# Patient Record
Sex: Male | Born: 1945
Health system: Southern US, Community
[De-identification: ages and names within clinical notes are randomized; demographics above are authoritative.]

## PROBLEM LIST (undated history)

## (undated) DIAGNOSIS — C61 Malignant neoplasm of prostate: Secondary | ICD-10-CM

## (undated) DIAGNOSIS — E785 Hyperlipidemia, unspecified: Secondary | ICD-10-CM

## (undated) DIAGNOSIS — K8020 Calculus of gallbladder without cholecystitis without obstruction: Secondary | ICD-10-CM

## (undated) DIAGNOSIS — I10 Essential (primary) hypertension: Secondary | ICD-10-CM

## (undated) DIAGNOSIS — I4891 Unspecified atrial fibrillation: Secondary | ICD-10-CM

## (undated) HISTORY — DX: Essential (primary) hypertension: I10

## (undated) HISTORY — DX: Unspecified atrial fibrillation: I48.91

## (undated) HISTORY — DX: Hyperlipidemia, unspecified: E78.5

## (undated) HISTORY — DX: Calculus of gallbladder without cholecystitis without obstruction: K80.20

## (undated) HISTORY — DX: Malignant neoplasm of prostate: C61

---

## 2004-04-02 HISTORY — PX: PROSTATECTOMY: SHX69

## 2004-04-10 ENCOUNTER — Ambulatory Visit: Payer: Self-pay | Admitting: Family Medicine

## 2004-06-09 ENCOUNTER — Ambulatory Visit: Payer: Self-pay | Admitting: Family Medicine

## 2004-08-09 ENCOUNTER — Ambulatory Visit: Payer: Self-pay | Admitting: Family Medicine

## 2005-04-02 HISTORY — PX: HERNIA REPAIR: SHX51

## 2005-06-07 ENCOUNTER — Ambulatory Visit: Payer: Self-pay | Admitting: Family Medicine

## 2011-01-23 ENCOUNTER — Institutional Professional Consult (permissible substitution): Payer: Self-pay | Admitting: Internal Medicine

## 2011-01-29 ENCOUNTER — Telehealth: Payer: Self-pay | Admitting: Internal Medicine

## 2011-01-29 ENCOUNTER — Encounter: Payer: Self-pay | Admitting: Internal Medicine

## 2011-01-29 NOTE — Telephone Encounter (Signed)
Spoke with the pt and he sttaes he has not had a fever, just sore throat and a cough. He is set to see MW for difficulty breathing so I advised the pt to keep his appt for consult. Carron Curie, CMA

## 2011-01-30 ENCOUNTER — Ambulatory Visit (INDEPENDENT_AMBULATORY_CARE_PROVIDER_SITE_OTHER): Payer: Medicare Other | Admitting: Internal Medicine

## 2011-01-30 ENCOUNTER — Encounter: Payer: Self-pay | Admitting: Internal Medicine

## 2011-01-30 VITALS — BP 112/64 | HR 75 | Temp 97.7°F | Ht 75.0 in | Wt 231.0 lb

## 2011-01-30 DIAGNOSIS — I10 Essential (primary) hypertension: Secondary | ICD-10-CM

## 2011-01-30 DIAGNOSIS — R0602 Shortness of breath: Secondary | ICD-10-CM

## 2011-01-30 MED ORDER — AMLODIPINE-VALSARTAN-HCTZ 5-160-12.5 MG PO TABS: ORAL_TABLET | ORAL | Status: AC

## 2011-01-30 NOTE — Progress Notes (Signed)
  Subjective:    Patient ID: Jonathan Wiley, male    DOB: 1945/06/06, 65 y.o.   MRN: [id number]  HPI  46 yowm smoked only as teenager with unexplained sob since around 2010 referred by Dr Sol Passer for evaluation in Oct 2012.   01/30/2011 Initial pulmonary office eval on ACEI . Cc doe x sev years esp with exertion gradually worse, indolent onset, highly variable and not necessarily proportionate to ex but rarely at rest.  He thinks he sleeps ok, wife convinced he wheezes when sleeping. pfts by Dr Sol Passer with no airflow obst but truncated max exp flows.  No excess mucus. Sometimes so severe limited to more than room to room walking.  Denies nocturnal  or early am exacerbation  of respiratory  c/o's or need for noct saba. Also denies any obvious fluctuation of symptoms with weather or environmental changes or other aggravating or alleviating factors except as outlined above    Review of Systems  Constitutional: Negative for fever, chills, activity change, appetite change and unexpected weight change.  HENT: Positive for sore throat and sneezing. Negative for congestion, rhinorrhea, trouble swallowing, dental problem, voice change and postnasal drip.   Eyes: Negative for visual disturbance.  Respiratory: Positive for shortness of breath. Negative for cough and choking.   Cardiovascular: Negative for chest pain and leg swelling.  Gastrointestinal: Negative for nausea, vomiting and abdominal pain.  Genitourinary: Negative for difficulty urinating.  Musculoskeletal: Negative for arthralgias.  Skin: Positive for rash.  Psychiatric/Behavioral: Negative for behavioral problems and confusion.       Objective:   Physical Exam  01/30/2011 wt  231  Very gruff voiced amb wm nad with classic pseudowheeze  HEENT: nl dentition, turbinates, and orophanx. Nl external ear canals without cough reflex   NECK :  without JVD/Nodes/TM/ nl carotid upstrokes bilaterally   LUNGS: no acc muscle use, clear to A  and P bilaterally without cough on insp or exp maneuvers   CV:  RRR  no s3 or murmur or increase in P2, no edema   ABD:  soft and nontender with nl excursion in the supine position. No bruits or organomegaly, bowel sounds nl  MS:  warm without deformities, calf tenderness, cyanosis or clubbing  SKIN: warm and dry without lesions    NEURO:  alert, approp, no deficits       Assessment & Plan:

## 2011-01-30 NOTE — Patient Instructions (Signed)
Stop amlodipine and lisnopril and replace them with one exforge daily  Try prilosec 20mg   Take 30-60 min before first meal of the day and Pepcid 20 mg one bedtime until better (should only be couple of weeks)  I think of reflux for chronic cough like I do oxygen for fire (doesn't cause the fire but once you get the oxygen suppressed it usually goes away regardless of the exact cause).   Call for CPST in 4 weeks if not convinced you're better   If you are satisfied with your treatment plan let your doctor know and he/she can either refill your medications or you can return here when your prescription runs out.     If in any way you are not 100% satisfied,  please tell us.  If 100% better, tell your friends!

## 2011-01-31 ENCOUNTER — Encounter: Payer: Self-pay | Admitting: Internal Medicine

## 2011-01-31 DIAGNOSIS — I10 Essential (primary) hypertension: Secondary | ICD-10-CM | POA: Insufficient documentation

## 2011-01-31 NOTE — Assessment & Plan Note (Signed)

## 2011-01-31 NOTE — Assessment & Plan Note (Addendum)
This is almost certainly  Classic Upper airway cough syndrome, so named because it's frequently impossible to sort out how much is  CR/sinusitis with freq throat clearing (which can be related to primary GERD)   vs  causing  secondary (" extra esophageal")  GERD from wide swings in gastric pressure that occur with throat clearing, often  promoting self use of mint and menthol lozenges that reduce the lower esophageal sphincter tone and exacerbate the problem further in a cyclical fashion.   These are the same pts who not infrequently have failed to tolerate ace inhibitors,  dry powder inhalers or biphosphonates or report having reflux symptoms that don't respond to standard doses of PPI , and are easily confused as having aecopd or asthma flares,   First step is stop acei then regroup if not back to baseline  Of note:  When respiratory symptoms begin well after a patient reports complete smoking cessation,  it is very hard to "blame" COPD specifically or airways disorders in general  ie it doesn't make any more sense than hearing a  [employer]SC[employer] driver wrecked his car while driving his kids to school or a surgeon sliced his hand off carving roast beef (it must be rare indeed!)     That is to say, once the high risk activity stops,  the symptoms should not suddenly erupt or markedly worsen.  If so, the differential diagnosis should include  obesity/deconditioning,  LPR/Reflux/Aspiration syndromes,  occult CHF, or  especially side effect of medications commonly used in this population., especially ace inhibitors.  LATE ADD: cxr reviewed from 11/2010 shows elevated R HD but no comparison studies, may need to consider sniff test under fluoroscopy if no previous cxr's can establish chronicity

## 2016-04-04 DIAGNOSIS — G5621 Lesion of ulnar nerve, right upper limb: Secondary | ICD-10-CM | POA: Diagnosis not present

## 2016-04-05 DIAGNOSIS — Z01818 Encounter for other preprocedural examination: Secondary | ICD-10-CM | POA: Diagnosis not present

## 2016-04-05 DIAGNOSIS — I10 Essential (primary) hypertension: Secondary | ICD-10-CM | POA: Diagnosis not present

## 2016-04-05 DIAGNOSIS — J45909 Unspecified asthma, uncomplicated: Secondary | ICD-10-CM | POA: Diagnosis not present

## 2016-04-05 DIAGNOSIS — G5621 Lesion of ulnar nerve, right upper limb: Secondary | ICD-10-CM | POA: Diagnosis not present

## 2016-04-12 DIAGNOSIS — E785 Hyperlipidemia, unspecified: Secondary | ICD-10-CM | POA: Diagnosis not present

## 2016-04-12 DIAGNOSIS — G4733 Obstructive sleep apnea (adult) (pediatric): Secondary | ICD-10-CM | POA: Diagnosis not present

## 2016-04-12 DIAGNOSIS — J208 Acute bronchitis due to other specified organisms: Secondary | ICD-10-CM | POA: Diagnosis not present

## 2016-04-12 DIAGNOSIS — J209 Acute bronchitis, unspecified: Secondary | ICD-10-CM | POA: Diagnosis not present

## 2016-04-12 DIAGNOSIS — I480 Paroxysmal atrial fibrillation: Secondary | ICD-10-CM | POA: Diagnosis not present

## 2016-04-12 DIAGNOSIS — E784 Other hyperlipidemia: Secondary | ICD-10-CM | POA: Diagnosis not present

## 2016-04-12 DIAGNOSIS — I10 Essential (primary) hypertension: Secondary | ICD-10-CM | POA: Diagnosis not present

## 2016-04-12 DIAGNOSIS — I2510 Atherosclerotic heart disease of native coronary artery without angina pectoris: Secondary | ICD-10-CM | POA: Diagnosis not present

## 2016-04-16 DIAGNOSIS — N2889 Other specified disorders of kidney and ureter: Secondary | ICD-10-CM | POA: Diagnosis not present

## 2016-04-16 DIAGNOSIS — K760 Fatty (change of) liver, not elsewhere classified: Secondary | ICD-10-CM | POA: Diagnosis not present

## 2016-04-16 DIAGNOSIS — Q6102 Congenital multiple renal cysts: Secondary | ICD-10-CM | POA: Diagnosis not present

## 2016-04-16 DIAGNOSIS — I700 Atherosclerosis of aorta: Secondary | ICD-10-CM | POA: Diagnosis not present

## 2016-04-16 DIAGNOSIS — C61 Malignant neoplasm of prostate: Secondary | ICD-10-CM | POA: Diagnosis not present

## 2016-04-16 DIAGNOSIS — N289 Disorder of kidney and ureter, unspecified: Secondary | ICD-10-CM | POA: Diagnosis not present

## 2016-04-17 DIAGNOSIS — E785 Hyperlipidemia, unspecified: Secondary | ICD-10-CM | POA: Diagnosis not present

## 2016-04-17 DIAGNOSIS — I2510 Atherosclerotic heart disease of native coronary artery without angina pectoris: Secondary | ICD-10-CM | POA: Diagnosis not present

## 2016-04-17 DIAGNOSIS — E784 Other hyperlipidemia: Secondary | ICD-10-CM | POA: Diagnosis not present

## 2016-04-17 DIAGNOSIS — I10 Essential (primary) hypertension: Secondary | ICD-10-CM | POA: Diagnosis not present

## 2016-04-17 DIAGNOSIS — I480 Paroxysmal atrial fibrillation: Secondary | ICD-10-CM | POA: Diagnosis not present

## 2016-04-26 DIAGNOSIS — N2889 Other specified disorders of kidney and ureter: Secondary | ICD-10-CM | POA: Diagnosis not present

## 2016-04-26 DIAGNOSIS — C61 Malignant neoplasm of prostate: Secondary | ICD-10-CM | POA: Diagnosis not present

## 2016-05-01 DIAGNOSIS — E785 Hyperlipidemia, unspecified: Secondary | ICD-10-CM | POA: Diagnosis not present

## 2016-05-01 DIAGNOSIS — K219 Gastro-esophageal reflux disease without esophagitis: Secondary | ICD-10-CM | POA: Diagnosis not present

## 2016-05-01 DIAGNOSIS — Z7901 Long term (current) use of anticoagulants: Secondary | ICD-10-CM | POA: Diagnosis not present

## 2016-05-01 DIAGNOSIS — I4891 Unspecified atrial fibrillation: Secondary | ICD-10-CM | POA: Diagnosis not present

## 2016-05-01 DIAGNOSIS — E663 Overweight: Secondary | ICD-10-CM | POA: Diagnosis not present

## 2016-05-01 DIAGNOSIS — Z9981 Dependence on supplemental oxygen: Secondary | ICD-10-CM | POA: Diagnosis not present

## 2016-05-01 DIAGNOSIS — G4730 Sleep apnea, unspecified: Secondary | ICD-10-CM | POA: Diagnosis not present

## 2016-05-01 DIAGNOSIS — G5621 Lesion of ulnar nerve, right upper limb: Secondary | ICD-10-CM | POA: Diagnosis not present

## 2016-05-01 DIAGNOSIS — Z79899 Other long term (current) drug therapy: Secondary | ICD-10-CM | POA: Diagnosis not present

## 2016-05-01 DIAGNOSIS — I10 Essential (primary) hypertension: Secondary | ICD-10-CM | POA: Diagnosis not present

## 2016-05-09 DIAGNOSIS — E870 Hyperosmolality and hypernatremia: Secondary | ICD-10-CM | POA: Diagnosis not present

## 2016-05-09 DIAGNOSIS — I4891 Unspecified atrial fibrillation: Secondary | ICD-10-CM | POA: Diagnosis not present

## 2016-05-09 DIAGNOSIS — Z9109 Other allergy status, other than to drugs and biological substances: Secondary | ICD-10-CM | POA: Diagnosis not present

## 2016-05-09 DIAGNOSIS — J45909 Unspecified asthma, uncomplicated: Secondary | ICD-10-CM | POA: Diagnosis not present

## 2016-05-09 DIAGNOSIS — J449 Chronic obstructive pulmonary disease, unspecified: Secondary | ICD-10-CM | POA: Diagnosis not present

## 2016-06-01 DIAGNOSIS — M5412 Radiculopathy, cervical region: Secondary | ICD-10-CM | POA: Diagnosis not present

## 2016-06-06 DIAGNOSIS — J449 Chronic obstructive pulmonary disease, unspecified: Secondary | ICD-10-CM | POA: Diagnosis not present

## 2016-06-11 DIAGNOSIS — G5621 Lesion of ulnar nerve, right upper limb: Secondary | ICD-10-CM | POA: Diagnosis not present

## 2016-06-29 DIAGNOSIS — G4733 Obstructive sleep apnea (adult) (pediatric): Secondary | ICD-10-CM | POA: Diagnosis not present

## 2016-07-09 DIAGNOSIS — H2513 Age-related nuclear cataract, bilateral: Secondary | ICD-10-CM | POA: Diagnosis not present

## 2016-08-13 DIAGNOSIS — K219 Gastro-esophageal reflux disease without esophagitis: Secondary | ICD-10-CM | POA: Diagnosis not present

## 2016-08-13 DIAGNOSIS — R0789 Other chest pain: Secondary | ICD-10-CM | POA: Diagnosis not present

## 2016-08-13 DIAGNOSIS — J449 Chronic obstructive pulmonary disease, unspecified: Secondary | ICD-10-CM | POA: Diagnosis not present

## 2016-08-13 DIAGNOSIS — Z87898 Personal history of other specified conditions: Secondary | ICD-10-CM | POA: Diagnosis not present

## 2016-08-13 DIAGNOSIS — Z79899 Other long term (current) drug therapy: Secondary | ICD-10-CM | POA: Diagnosis not present

## 2016-08-13 DIAGNOSIS — I10 Essential (primary) hypertension: Secondary | ICD-10-CM | POA: Diagnosis not present

## 2016-08-13 DIAGNOSIS — E785 Hyperlipidemia, unspecified: Secondary | ICD-10-CM | POA: Diagnosis not present

## 2016-08-21 DIAGNOSIS — L821 Other seborrheic keratosis: Secondary | ICD-10-CM | POA: Diagnosis not present

## 2016-08-21 DIAGNOSIS — D1801 Hemangioma of skin and subcutaneous tissue: Secondary | ICD-10-CM | POA: Diagnosis not present

## 2016-09-05 DIAGNOSIS — G4730 Sleep apnea, unspecified: Secondary | ICD-10-CM | POA: Diagnosis not present

## 2016-09-05 DIAGNOSIS — I2510 Atherosclerotic heart disease of native coronary artery without angina pectoris: Secondary | ICD-10-CM | POA: Diagnosis not present

## 2016-09-05 DIAGNOSIS — I480 Paroxysmal atrial fibrillation: Secondary | ICD-10-CM | POA: Diagnosis not present

## 2016-09-13 DIAGNOSIS — G5621 Lesion of ulnar nerve, right upper limb: Secondary | ICD-10-CM | POA: Diagnosis not present

## 2016-09-27 DIAGNOSIS — I480 Paroxysmal atrial fibrillation: Secondary | ICD-10-CM | POA: Diagnosis not present

## 2016-10-01 ENCOUNTER — Ambulatory Visit: Payer: Medicare Other | Admitting: Cardiology

## 2016-10-01 DIAGNOSIS — I480 Paroxysmal atrial fibrillation: Secondary | ICD-10-CM | POA: Diagnosis not present

## 2016-10-30 DIAGNOSIS — T63481A Toxic effect of venom of other arthropod, accidental (unintentional), initial encounter: Secondary | ICD-10-CM | POA: Diagnosis not present

## 2016-10-30 DIAGNOSIS — M7989 Other specified soft tissue disorders: Secondary | ICD-10-CM | POA: Diagnosis not present

## 2016-11-05 ENCOUNTER — Ambulatory Visit (INDEPENDENT_AMBULATORY_CARE_PROVIDER_SITE_OTHER): Payer: PPO | Admitting: Specialist

## 2016-11-05 ENCOUNTER — Encounter (INDEPENDENT_AMBULATORY_CARE_PROVIDER_SITE_OTHER): Payer: Self-pay | Admitting: Specialist

## 2016-11-05 VITALS — BP 126/88 | HR 86 | Ht 75.0 in | Wt 233.0 lb

## 2016-11-05 DIAGNOSIS — I4891 Unspecified atrial fibrillation: Secondary | ICD-10-CM | POA: Diagnosis not present

## 2016-11-05 DIAGNOSIS — M6281 Muscle weakness (generalized): Secondary | ICD-10-CM

## 2016-11-05 DIAGNOSIS — G5621 Lesion of ulnar nerve, right upper limb: Secondary | ICD-10-CM

## 2016-11-05 DIAGNOSIS — G4733 Obstructive sleep apnea (adult) (pediatric): Secondary | ICD-10-CM | POA: Diagnosis not present

## 2016-11-05 DIAGNOSIS — M4802 Spinal stenosis, cervical region: Secondary | ICD-10-CM | POA: Diagnosis not present

## 2016-11-05 DIAGNOSIS — I10 Essential (primary) hypertension: Secondary | ICD-10-CM | POA: Diagnosis not present

## 2016-11-05 NOTE — Progress Notes (Signed)
Office Visit Note   Patient: Jonathan Wiley           Date of Birth: [birth year]           MRN: [id number] Visit Date: 11/05/2016              Requested by: Creig Hines, MD [place], [place] [address] PCP: Melony Overly, MD   Assessment & Plan: Visit Diagnoses:  1. Spinal stenosis of cervical region   2. 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 Muscle weakness of left arm     Plan:Avoid overhead lifting and overhead use of the arms. Do not lift greater than 15 lbs. Adjust head rest in vehicle to prevent hyperextension if rear ended. Take extra precautions to avoid falling, including use of a cane if you feel weak. .     Follow-Up Instructions: Return in about 4 weeks (around 12/03/2016).   Orders:  No orders of the defined types were placed in this encounter.  No orders of the defined types were placed in this encounter.     Procedures: No procedures performed   Clinical Data: No additional findings.   Subjective: Chief Complaint  Patient presents with  . Neck - Pain  . Lower Back - Pain    71 year old male right handed, with about a one year history of neck pain with rectal surgery 09/2015. Had numbness into the right hand ulnar digits, 2 EMG/NCVs tests were negative. MRI of the cervical spine was done. He underwent right ulnar neuroplasty 05/2016. The MRI of the neck showed areas of concern that has the paitent concerned about future neck issues. Presently occasionally with neck pain posterior cervicodorsal and to the left side and mid area. No bowel or bladder difficulties. Had MRI of left shoulder. Both MRIs of the shoulder and cervical spine were done  [facility]  Some drainage for the rectal area. The infection was a perirectal abcess. No balance or coordination abnormalities. Saw a neurologist in the past told him that he had a weakened nervous system. Taking gabapentin 100 mg at night. There is some cervical spine stiffness and  sometimes with a popping sound. No increased clumbsiness.       Review of Systems  Constitutional: Negative for activity change, appetite change, chills, fatigue, fever and unexpected weight change.  HENT: Negative for congestion, facial swelling, sinus pain, sinus pressure, sneezing, sore throat and tinnitus.   Eyes: Positive for visual disturbance. Negative for pain, discharge, redness and itching.  Respiratory: Positive for apnea. Negative for cough, choking, shortness of breath and wheezing.   Cardiovascular: Negative.  Negative for chest pain, palpitations and leg swelling.  Gastrointestinal: Positive for constipation and rectal pain. Negative for abdominal distention, abdominal pain, anal bleeding, blood in stool, diarrhea, nausea and vomiting.  Endocrine: Negative.   Genitourinary: Negative.  Negative for difficulty urinating, dysuria, flank pain, frequency and hematuria.  Musculoskeletal: Positive for back pain, neck pain and neck stiffness. Negative for arthralgias, gait problem and joint swelling.  Skin: Negative.  Negative for color change, pallor, rash and wound.  Allergic/Immunologic: Negative.  Negative for environmental allergies and food allergies.  Neurological: Positive for weakness and numbness.  Hematological: Negative for adenopathy. Bruises/bleeds easily.  Psychiatric/Behavioral: Negative.  Negative for agitation, behavioral problems, confusion, decreased concentration, dysphoric mood, hallucinations, self-injury, sleep disturbance and suicidal ideas. The patient is not nervous/anxious and is not hyperactive.      Objective: Vital Signs: BP 126/88 (  BP Location: Left Arm, Patient Position: Sitting, Cuff Size: Normal)   Pulse 86   Ht 6\' 3"  (1.905 m)   Wt 233 lb (105.7 kg)   BMI 29.12 kg/m   Physical Exam  Constitutional: He is oriented to person, place, and time. He appears well-developed and well-nourished.  HENT:  Head: Normocephalic and atraumatic.  Eyes:  Pupils are equal, round, and reactive to light. EOM are normal.  Neck: Normal range of motion. Neck supple.  Pulmonary/Chest: Effort normal and breath sounds normal.  Abdominal: Soft. Bowel sounds are normal.  Musculoskeletal: Normal range of motion.  Neurological: He is alert and oriented to person, place, and time.  Skin: Skin is warm and dry.  Psychiatric: He has a normal mood and affect. His behavior is normal. Judgment and thought content normal.    Ortho Exam  Specialty Comments:  No specialty comments available.  Imaging: No results found.   PMFS History: Patient Active Problem List   Diagnosis Date Noted  . Hypertension 01/31/2011  . SOB (shortness of breath) 01/30/2011   Past Medical History:  Diagnosis Date  . Atrial fibrillation ([place])   . Gallstones   . Hyperlipidemia   . Hypertension   . Prostate cancer [facility])     Family History  Problem Relation Age of Onset  . Clotting disorder Mother   . Prostate cancer Father     Past Surgical History:  Procedure Laterality Date  . HERNIA REPAIR  2007  . PROSTATECTOMY  2006   Social History   Occupational History  . 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 Smoking status: Former Smoker    Packs/day: 0.30    Years: 3.00    Types: Cigarettes    Quit date: 04/02/1958  . Smokeless tobacco: Never Used  . Alcohol use No  . Drug use: No  . Sexual activity: Not on file

## 2016-11-05 NOTE — Patient Instructions (Signed)
Plan:Avoid overhead lifting and overhead use of the arms. Do not lift greater than 15 lbs. Adjust head rest in vehicle to prevent hyperextension if rear ended. Take extra precautions to avoid falling, including use of a cane if you feel weak.

## 2016-12-06 ENCOUNTER — Ambulatory Visit (INDEPENDENT_AMBULATORY_CARE_PROVIDER_SITE_OTHER): Payer: PPO | Admitting: Specialist

## 2016-12-06 ENCOUNTER — Other Ambulatory Visit (INDEPENDENT_AMBULATORY_CARE_PROVIDER_SITE_OTHER): Payer: Self-pay | Admitting: Specialist

## 2016-12-06 ENCOUNTER — Ambulatory Visit (INDEPENDENT_AMBULATORY_CARE_PROVIDER_SITE_OTHER): Payer: PPO

## 2016-12-06 DIAGNOSIS — M4802 Spinal stenosis, cervical region: Secondary | ICD-10-CM

## 2016-12-06 DIAGNOSIS — G5621 Lesion of ulnar nerve, right upper limb: Secondary | ICD-10-CM

## 2016-12-06 NOTE — Patient Instructions (Addendum)
Avoid overhead lifting and overhead use of the arms. Do not lift greater than 5 lbs. Adjust head rest in vehicle to prevent hyperextension if rear ended. Dr. Romona Curls secretary will contact you to arrange for EMG/NCV of the arms to assess the healing of the right ulnar nerve and for any signs of cervical nerve irritation.

## 2016-12-06 NOTE — Progress Notes (Signed)
   Office Visit Note   Patient: Jonathan Wiley           Date of Birth: 1945/12/17           MRN: [id number] Visit Date: 12/06/2016              Requested by: Melony Overly, MD [address] [place], [place] [address] PCP: Melony Overly, MD   Assessment & Plan: Visit Diagnoses:  1. Spinal stenosis of cervical region   2. 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 Do not lift greater than 5 lbs. Adjust head rest in vehicle to prevent hyperextension if rear ended. Dr. Romona Curls secretary will contact you to arrange for EMG/NCV of the arms to assess the healing of the right ulnar nerve and for any signs of cervical nerve irritation.    Follow-Up Instructions: No Follow-up on file.   Orders:  Orders Placed This Encounter  Procedures  . XR Cervical Spine 2 or 3 views   No orders of the defined types were placed in this encounter.     Procedures: No procedures performed   Clinical Data: No additional findings.   Subjective: No chief complaint on file.   HPI  Review of Systems   Objective: Vital Signs: There were no vitals taken for this visit.  Physical Exam  Ortho Exam  Specialty Comments:  No specialty comments available.  Imaging: No results found.   PMFS History: Patient Active Problem List   Diagnosis Date Noted  . Hypertension 01/31/2011  . SOB (shortness of breath) 01/30/2011   Past Medical History:  Diagnosis Date  . Atrial fibrillation ([place])   . Gallstones   . Hyperlipidemia   . Hypertension   . Prostate cancer [facility])     Family History  Problem Relation Age of Onset  . Clotting disorder Mother   . Prostate cancer Father     Past Surgical History:  Procedure Laterality Date  . HERNIA REPAIR  2007  . PROSTATECTOMY  2006   Social History   Occupational History  . 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 Smoking status: Former  Smoker    Packs/day: 0.30    Years: 3.00    Types: Cigarettes    Quit date: 04/02/1958  . Smokeless tobacco: Never Used  . Alcohol use No  . Drug use: No  . Sexual activity: Not on file

## 2016-12-18 DIAGNOSIS — H2512 Age-related nuclear cataract, left eye: Secondary | ICD-10-CM | POA: Diagnosis not present

## 2016-12-18 DIAGNOSIS — H2513 Age-related nuclear cataract, bilateral: Secondary | ICD-10-CM | POA: Diagnosis not present

## 2016-12-18 DIAGNOSIS — H25013 Cortical age-related cataract, bilateral: Secondary | ICD-10-CM | POA: Diagnosis not present

## 2016-12-18 DIAGNOSIS — I10 Essential (primary) hypertension: Secondary | ICD-10-CM | POA: Diagnosis not present

## 2016-12-18 DIAGNOSIS — H25043 Posterior subcapsular polar age-related cataract, bilateral: Secondary | ICD-10-CM | POA: Diagnosis not present

## 2016-12-21 ENCOUNTER — Encounter (INDEPENDENT_AMBULATORY_CARE_PROVIDER_SITE_OTHER): Payer: Self-pay | Admitting: Physical Medicine and Rehabilitation

## 2016-12-21 ENCOUNTER — Ambulatory Visit (INDEPENDENT_AMBULATORY_CARE_PROVIDER_SITE_OTHER): Payer: PPO | Admitting: Physical Medicine and Rehabilitation

## 2016-12-21 DIAGNOSIS — R202 Paresthesia of skin: Secondary | ICD-10-CM

## 2016-12-21 NOTE — Progress Notes (Deleted)
Right hand dominant. Tingling mostly in right fourth and fifth fingers. Patient said Dr. Louanne Skye also wanted to look at left arm- referral states right. Has weakness in left arm, but no tingling.

## 2016-12-25 NOTE — Progress Notes (Signed)
Jonathan Wiley - 71 y.o. male MRN [id number]  Date of birth: [birth year]  Office Visit Note: Visit Date: 12/21/2016 PCP: Melony Overly, MD Referred by: Melony Overly, MD  Subjective: Chief Complaint  Patient presents with  . Right Hand - Numbness  . Left Arm - Weakness   HPI: Jonathan Wiley is a very pleasant 71 year old right-hand dominant gentleman who comes in today at the request of Dr. Louanne Skye for electrodiagnostic study of the right upper extremity to assess healing of the older nerve after right ulnar neuroplasty 05/2016 as well as evaluating any radiculopathy present. The patient's history is somewhat convoluted. He has had most of his care and [place] through [facility]. He reports tingling type symptoms in the right fourth and fifth fingers. He reports some neck pain. He also reports weakness in the left arm but no pain or tingling or numbness. He has had a prior electrodiagnostic study by Dr. Barrington Ellison At [facility] and [facility]. This was completed 10/17/2015 and reviewed below the specialty comments. Unfortunately we do not have the actual study but we did have the impression. The impression was a generalized sensorimotor polyneuropathy with some axonal features. They also diagnosed to superimpose right ulnar neuropathy without mention of the severity. The patient went on to have an MRI of the cervical spine on 11/16/2015 showing moderate central canal stenosis and some foraminal stenosis at C5-6. There was a retrolisthesis MC 5 on C6. The patient then went on to have an ulnar nerve neuroplasty in February 2018 as mentioned above. He reports that between the July 2017 nerve study and his surgery of the ulnar nerve there was another electrodiagnostic study performed. He does not know the results of that we do not have that report to review.    ROS Otherwise per HPI.  Assessment & Plan: Visit Diagnoses:  1. Paresthesia of skin     Plan: No additional  findings.  Impression: The above electrodiagnostic study is ABNORMAL and reveals evidence of a mild right median nerve entrapment at the wrist (carpal tunnel syndrome) affecting sensory components.   There is no significant electrodiagnostic evidence of any other focal nerve entrapment, brachial plexopathy or cervical radiculopathy. **Specifically, the right ulnar motor and sensory nerve conductions were normal. At least in the right upper extremity there is no evidence of a sensory motor polyneuropathy which was diagnosed in a prior electrodiagnostic study. The patient has had electrodiagnostic study that was completed after the July 2017 study and was also after the ulnar nerve decompression. This study would be interesting to review in light of normal findings today.  As you know, this particular electrodiagnostic study cannot rule out chemical radiculitis or sensory only radiculopathy.  Recommendations: 1.  Follow-up with referring physician. 2.  Continue current management of symptoms. 3.  Suggest use of resting splint at night-time and as needed during the day. Consider diagnostic carpal tunnel injection.  Meds & Orders: No orders of the defined types were placed in this encounter.   Orders Placed This Encounter  Procedures  . NCV with EMG (electromyography)    Follow-up: Return for Dr. Louanne Skye.   Procedures: No procedures performed  EMG & NCV Findings: Evaluation of the right median (across palm) sensory nerve showed prolonged distal peak latency (Wrist, 4.4 ms) and prolonged distal peak latency (Palm, 2.4 ms).  All remaining nerves (as indicated in the following tables) were within normal limits.    All examined muscles (as indicated in the  following table) showed no evidence of electrical instability.    Impression: The above electrodiagnostic study is ABNORMAL and reveals evidence of a mild right median nerve entrapment at the wrist (carpal tunnel syndrome) affecting sensory  components.   There is no significant electrodiagnostic evidence of any other focal nerve entrapment, brachial plexopathy or cervical radiculopathy. Specifically, the right ulnar motor and sensory nerve conductions were normal. At least in the right upper extremity there is no evidence of a sensory motor polyneuropathy which was diagnosed in a prior electrodiagnostic study. The patient has had electrodiagnostic study that was completed after the July 2017 study and was also after the ulnar nerve decompression. This study would be interesting to review in light of normal findings today.  Recommendations: 1.  Follow-up with referring physician. 2.  Continue current management of symptoms. 3.  Suggest use of resting splint at night-time and as needed during the day. Consider diagnostic carpal tunnel injection.   Nerve Conduction Studies Anti Sensory Summary Table   Stim Site NR Peak (ms) Norm Peak (ms) P-T Amp (V) Norm P-T Amp Site1 Site2 Delta-P (ms) Dist (cm) Vel (m/s) Norm Vel (m/s)  Right Median Acr Palm Anti Sensory (2nd Digit)  32.2C  Wrist    *4.4 <3.6 20.6 >10 Wrist Palm 2.0 0.0    Palm    *2.4 <2.0 20.3         Right Ulnar Anti Sensory (5th Digit)  32.5C  Wrist    3.4 <3.7 15.9 >15.0 Wrist 5th Digit 3.4 14.0 41 >38   Motor Summary Table   Stim Site NR Onset (ms) Norm Onset (ms) O-P Amp (mV) Norm O-P Amp Site1 Site2 Delta-0 (ms) Dist (cm) Vel (m/s) Norm Vel (m/s)  Right Median Motor (Abd Poll Brev)  32.9C  Wrist    4.0 <4.2 7.6 >5 Elbow Wrist 4.5 24.0 53 >50  Elbow    8.5  6.5         Right Ulnar Motor (Abd Dig Min)  32.6C  Wrist    3.2 <4.2 9.0 >3 B Elbow Wrist 4.5 25.0 56 >53  B Elbow    7.7  9.0  A Elbow B Elbow 1.7 10.0 59 >53  A Elbow    9.4  9.0          EMG   Side Muscle Nerve Root Ins Act Fibs Psw Amp Dur Poly Recrt Int Fraser Din Comment  Right 1stDorInt Ulnar C8-T1 Nml Nml Nml Nml Nml 0 Nml Nml   Right Abd Poll Brev Median C8-T1 Nml Nml Nml Nml Nml 0 Nml Nml   Right  ExtDigCom   Nml Nml Nml Nml Nml 0 Nml Nml   Right Triceps Radial C6-7-8 Nml Nml Nml Nml Nml 0 Nml Nml   Right Deltoid Axillary C5-6 Nml Nml Nml Nml Nml 0 Nml Nml     Nerve Conduction Studies Anti Sensory Left/Right Comparison   Stim Site L Lat (ms) R Lat (ms) L-R Lat (ms) L Amp (V) R Amp (V) L-R Amp (%) Site1 Site2 L Vel (m/s) R Vel (m/s) L-R Vel (m/s)  Median Acr Palm Anti Sensory (2nd Digit)  32.2C  Wrist  *4.4   20.6  Wrist Palm     Palm  *2.4   20.3        Ulnar Anti Sensory (5th Digit)  32.5C  Wrist  3.4   15.9  Wrist 5th Digit  41    Motor Left/Right Comparison   Stim Site L Lat (ms)  R Lat (ms) L-R Lat (ms) L Amp (mV) R Amp (mV) L-R Amp (%) Site1 Site2 L Vel (m/s) R Vel (m/s) L-R Vel (m/s)  Median Motor (Abd Poll Brev)  32.9C  Wrist  4.0   7.6  Elbow Wrist  53   Elbow  8.5   6.5        Ulnar Motor (Abd Dig Min)  32.6C  Wrist  3.2   9.0  B Elbow Wrist  56   B Elbow  7.7   9.0  A Elbow B Elbow  59   A Elbow  9.4   9.0           Waveforms:           Clinical History: EMG/NCV 10/17/15: By Dr. Barrington Ellison At [facility] Impression: 71 year old male who appears to have a generalized sensorimotor polyneuropathy with both axonal and some demyelinating features. He states this occurred after being on antibiotics for rectal abscess. Could this have been a reaction to the antibiotics or part of a CIDP picture is uncertain. I have discussed the findings of his nerve conduction studies with him.  Superimposed right ulnar neuropathy most likely at the elbow on the right side. I cannot exclude a more proximal or cervical etiology.   Abnormal MRI of the Cervical spine without contrast. 11/16/2015   1. C5-C6 mild-moderate central spinal canal stenosis secondary to degenerative disc and endplate changes. No cord myelopathic signal changes. Bilateral neural foraminal stenosis with possible dynamic impingement upon either exiting C6 nerve roots. 2. Multilevel  degenerative disc changes with associated minor disc bulge effacing the thecal sac at C3-4, C4-5, C6-7, C7-T1. Result Narrative  FINDINGS: Levels Imaged: Craniocervical junction to T3-4. Alignment: C5 grade 1 retrolisthesis upon C6. C3 grade 1 retrolisthesis upon C4.Preserved spine curvature. Vertebrae: No marrow signal abnormalities.  Spinal cord: Normal signal and contour. Cervicocranial junction: No significant focal abnormality. Dominant left vertebral artery seen. C1-C2: No significant focal abnormality. C2-C3: No significant focal abnormality. C3-C4: Posterior central disc bulge effaces the ventral thecal sac. C4-C5: Posterior central disc bulge effaces the ventral thecal sac. C5-C6: Large broad-based disc bulge is seen with bilateral uncovertebral hypertrophy. Produces mild-moderate central spinal canal stenosis. Severe right, moderate-severe left neural foraminal stenosis with possible dynamic impingement upon either exiting C6 nerve roots. C6-C7: Mild posterior disc prominence effaces the ventral thecal sac. Mild left neural foraminal stenosis. C7-T1: Posterior disc prominence effaces the ventral thecal sac. Upper Thoracic Spine: No significant focal abnormality.  He reports that he quit smoking about 58 years ago. His smoking use included Cigarettes. He has a 0.90 pack-year smoking history. He has never used smokeless tobacco. No results for input(s): HGBA1C, LABURIC in the last 8760 hours.  Objective:  VS:  HT:    WT:   BMI:     BP:   HR: bpm  TEMP: ( )  RESP:  Physical Exam  Musculoskeletal:  Inspection reveals no atrophy of the bilateral APB or FDI or hand intrinsics. There is no swelling, color changes, allodynia or dystrophic changes. There is well-healed surgical scar over the right cubital tunnel. There is 5 out of 5 strength in the bilateral wrist extension, finger abduction and long finger flexion. There is intact sensation to light touch in all dermatomal and  peripheral nerve distributions. There is a negative Froment's test bilaterally. There is a negative Hoffmann's test bilaterally.    Ortho Exam Imaging: No results found.  Past Medical/Family/Surgical/Social History: Medications & Allergies  reviewed per EMR Patient Active Problem List   Diagnosis Date Noted  . Hypertension 01/31/2011  . SOB (shortness of breath) 01/30/2011   Past Medical History:  Diagnosis Date  . Atrial fibrillation ([place])   . Gallstones   . Hyperlipidemia   . Hypertension   . Prostate cancer [facility])    Family History  Problem Relation Age of Onset  . Clotting disorder Mother   . Prostate cancer Father    Past Surgical History:  Procedure Laterality Date  . HERNIA REPAIR  2007  . PROSTATECTOMY  2006   Social History   Occupational History  . 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 Smoking status: Former Smoker    Packs/day: 0.30    Years: 3.00    Types: Cigarettes    Quit date: 04/02/1958  . Smokeless tobacco: Never Used  . Alcohol use No  . Drug use: No  . Sexual activity: Not on file

## 2016-12-25 NOTE — Procedures (Signed)
EMG & NCV Findings: Evaluation of the right median (across palm) sensory nerve showed prolonged distal peak latency (Wrist, 4.4 ms) and prolonged distal peak latency (Palm, 2.4 ms).  All remaining nerves (as indicated in the following tables) were within normal limits.    All examined muscles (as indicated in the following table) showed no evidence of electrical instability.    Impression: The above electrodiagnostic study is ABNORMAL and reveals evidence of a mild right median nerve entrapment at the wrist (carpal tunnel syndrome) affecting sensory components.   There is no significant electrodiagnostic evidence of any other focal nerve entrapment, brachial plexopathy or cervical radiculopathy. Specifically, the right ulnar motor and sensory nerve conductions were normal. At least in the right upper extremity there is no evidence of a sensory motor polyneuropathy which was diagnosed in a prior electrodiagnostic study. The patient has had electrodiagnostic study that was completed after the July 2017 study and was also after the ulnar nerve decompression. This study would be interesting to review in light of normal findings today.  Recommendations: 1.  Follow-up with referring physician. 2.  Continue current management of symptoms. 3.  Suggest use of resting splint at night-time and as needed during the day. Consider diagnostic carpal tunnel injection.   Nerve Conduction Studies Anti Sensory Summary Table   Stim Site NR Peak (ms) Norm Peak (ms) P-T Amp (V) Norm P-T Amp Site1 Site2 Delta-P (ms) Dist (cm) Vel (m/s) Norm Vel (m/s)  Right Median Acr Palm Anti Sensory (2nd Digit)  32.2C  Wrist    *4.4 <3.6 20.6 >10 Wrist Palm 2.0 0.0    Palm    *2.4 <2.0 20.3         Right Ulnar Anti Sensory (5th Digit)  32.5C  Wrist    3.4 <3.7 15.9 >15.0 Wrist 5th Digit 3.4 14.0 41 >38   Motor Summary Table   Stim Site NR Onset (ms) Norm Onset (ms) O-P Amp (mV) Norm O-P Amp Site1 Site2 Delta-0 (ms) Dist  (cm) Vel (m/s) Norm Vel (m/s)  Right Median Motor (Abd Poll Brev)  32.9C  Wrist    4.0 <4.2 7.6 >5 Elbow Wrist 4.5 24.0 53 >50  Elbow    8.5  6.5         Right Ulnar Motor (Abd Dig Min)  32.6C  Wrist    3.2 <4.2 9.0 >3 B Elbow Wrist 4.5 25.0 56 >53  B Elbow    7.7  9.0  A Elbow B Elbow 1.7 10.0 59 >53  A Elbow    9.4  9.0          EMG   Side Muscle Nerve Root Ins Act Fibs Psw Amp Dur Poly Recrt Int Fraser Din Comment  Right 1stDorInt Ulnar C8-T1 Nml Nml Nml Nml Nml 0 Nml Nml   Right Abd Poll Brev Median C8-T1 Nml Nml Nml Nml Nml 0 Nml Nml   Right ExtDigCom   Nml Nml Nml Nml Nml 0 Nml Nml   Right Triceps Radial C6-7-8 Nml Nml Nml Nml Nml 0 Nml Nml   Right Deltoid Axillary C5-6 Nml Nml Nml Nml Nml 0 Nml Nml     Nerve Conduction Studies Anti Sensory Left/Right Comparison   Stim Site L Lat (ms) R Lat (ms) L-R Lat (ms) L Amp (V) R Amp (V) L-R Amp (%) Site1 Site2 L Vel (m/s) R Vel (m/s) L-R Vel (m/s)  Median Acr Palm Anti Sensory (2nd Digit)  32.2C  Wrist  *4.4   20.6  Wrist Palm     Palm  *2.4   20.3        Ulnar Anti Sensory (5th Digit)  32.5C  Wrist  3.4   15.9  Wrist 5th Digit  41    Motor Left/Right Comparison   Stim Site L Lat (ms) R Lat (ms) L-R Lat (ms) L Amp (mV) R Amp (mV) L-R Amp (%) Site1 Site2 L Vel (m/s) R Vel (m/s) L-R Vel (m/s)  Median Motor (Abd Poll Brev)  32.9C  Wrist  4.0   7.6  Elbow Wrist  53   Elbow  8.5   6.5        Ulnar Motor (Abd Dig Min)  32.6C  Wrist  3.2   9.0  B Elbow Wrist  56   B Elbow  7.7   9.0  A Elbow B Elbow  59   A Elbow  9.4   9.0           Waveforms:

## 2017-01-07 DIAGNOSIS — H2512 Age-related nuclear cataract, left eye: Secondary | ICD-10-CM | POA: Diagnosis not present

## 2017-01-07 DIAGNOSIS — H2513 Age-related nuclear cataract, bilateral: Secondary | ICD-10-CM | POA: Diagnosis not present

## 2017-01-08 DIAGNOSIS — H2511 Age-related nuclear cataract, right eye: Secondary | ICD-10-CM | POA: Diagnosis not present

## 2017-01-09 ENCOUNTER — Ambulatory Visit (INDEPENDENT_AMBULATORY_CARE_PROVIDER_SITE_OTHER): Payer: PPO | Admitting: Specialist

## 2017-01-15 DIAGNOSIS — Z23 Encounter for immunization: Secondary | ICD-10-CM | POA: Diagnosis not present

## 2017-01-22 DIAGNOSIS — I482 Chronic atrial fibrillation: Secondary | ICD-10-CM | POA: Diagnosis not present

## 2017-01-22 DIAGNOSIS — G4733 Obstructive sleep apnea (adult) (pediatric): Secondary | ICD-10-CM | POA: Diagnosis not present

## 2017-01-28 DIAGNOSIS — H2511 Age-related nuclear cataract, right eye: Secondary | ICD-10-CM | POA: Diagnosis not present

## 2017-02-06 ENCOUNTER — Encounter (INDEPENDENT_AMBULATORY_CARE_PROVIDER_SITE_OTHER): Payer: Self-pay | Admitting: Specialist

## 2017-02-06 ENCOUNTER — Ambulatory Visit (INDEPENDENT_AMBULATORY_CARE_PROVIDER_SITE_OTHER): Payer: PPO | Admitting: Specialist

## 2017-02-06 VITALS — BP 113/72 | HR 93 | Ht 75.0 in | Wt 225.0 lb

## 2017-02-06 DIAGNOSIS — G5601 Carpal tunnel syndrome, right upper limb: Secondary | ICD-10-CM | POA: Diagnosis not present

## 2017-02-06 DIAGNOSIS — M47812 Spondylosis without myelopathy or radiculopathy, cervical region: Secondary | ICD-10-CM | POA: Diagnosis not present

## 2017-02-06 NOTE — Patient Instructions (Addendum)
Avoid overhead lifting and overhead use of the arms. Do not lift greater than 5-10 lbs. Adjust head rest in vehicle to prevent hyperextension if rear ended. Take extra precautions to avoid falling. Use a wrist splint for CTS at night or during day when symptomatic.  VItamin B complex and fish oil are of benefit in nerve regeneration.

## 2017-02-06 NOTE — Progress Notes (Signed)
Office Visit Note   Patient: Jonathan Wiley           Date of Birth: [birth year]           MRN: [id number] Visit Date: 02/06/2017              Requested by: Melony Overly, MD [address] [place], [place] [address] PCP: Melony Overly, MD   Assessment & Plan: Visit Diagnoses:  1. Carpal tunnel syndrome, right upper limb   2. Spondylosis without myelopathy or radiculopathy, cervical region     Plan: Avoid overhead lifting and overhead use of the arms. Do not lift greater than 5-10 lbs. Adjust head rest in vehicle to prevent hyperextension if rear ended. Take extra precautions to avoid falling. Use a wrist splint for CTS at night or during day when symptomatic. VItamin B complex and fish oil are of benefit in nerve regeneration. Follow-Up Instructions: Return in about 6 months (around 08/06/2017).   Orders:  No orders of the defined types were placed in this encounter.  No orders of the defined types were placed in this encounter.     Procedures: No procedures performed   Clinical Data: No additional findings.   Subjective: Chief Complaint  Patient presents with  . Neck - Follow-up    Patient had EMG on 12/21/2016    71 year old male with left arm numbness and weakness improved with PT. Previous right ulnar neuropathy at the elbow for which he underwent a right ulnar n. Release. He has noted improved function and sensation into the right hand ulnar side since the right elbow surgery. No pain with coughing or sneezing. He had EMGNCV of the Right arm and  And some studies of the left arm but mostly directed    Review of Systems  Constitutional: Negative.   HENT: Negative.   Eyes: Negative.   Respiratory: Negative.   Cardiovascular: Negative.   Gastrointestinal: Negative.   Endocrine: Negative.   Genitourinary: Negative.   Musculoskeletal: Negative.   Skin: Negative.   Allergic/Immunologic: Negative.   Neurological: Negative.   Hematological:  Negative.   Psychiatric/Behavioral: Negative.      Objective: Vital Signs: BP 113/72 (BP Location: Left Arm, Patient Position: Sitting)   Pulse 93   Ht 6\' 3"  (1.905 m)   Wt 225 lb (102.1 kg)   BMI 28.12 kg/m   Physical Exam  Constitutional: He is oriented to person, place, and time. He appears well-developed and well-nourished.  HENT:  Head: Normocephalic and atraumatic.  Eyes: EOM are normal. Pupils are equal, round, and reactive to light.  Neck: Normal range of motion. Neck supple.  Pulmonary/Chest: Effort normal and breath sounds normal.  Abdominal: Soft. Bowel sounds are normal.  Musculoskeletal: Normal range of motion.  Neurological: He is alert and oriented to person, place, and time.  Skin: Skin is warm and dry.  Psychiatric: He has a normal mood and affect. His behavior is normal. Judgment and thought content normal.    Right Hand Exam   Tenderness  The patient is experiencing tenderness in the dorsal area.  Range of Motion  Wrist  Extension: normal  Flexion: normal  Pronation: normal  Supination: normal   Muscle Strength  Wrist extension: 5/5  Wrist flexion: 5/5  Grip: 5/5   Tests  Phalen's Sign: negative Tinel's sign (median nerve): negative Finkelstein's test: negative  Other  Erythema: absent Scars: absent Sensation: normal Pulse: present   Left Hand Exam   Tenderness  The patient is experiencing tenderness in the dorsal area.   Range of Motion  Wrist  Extension: normal  Flexion: normal  Pronation: normal  Supination: normal   Muscle Strength  Wrist extension: 5/5  Wrist flexion: 5/5  Grip:  5/5   Tests  Phalen's Sign: negative Tinel's sign (median nerve): negative Finkelstein's test: negative  Other  Erythema: absent Scars: absent Sensation: normal Pulse: present      Specialty Comments:  No specialty comments available.  Imaging: No results found.   PMFS History: Patient Active Problem List   Diagnosis Date  Noted  . Hypertension 01/31/2011  . SOB (shortness of breath) 01/30/2011   Past Medical History:  Diagnosis Date  . Atrial fibrillation ([place])   . Gallstones   . Hyperlipidemia   . Hypertension   . Prostate cancer [facility])     Family History  Problem Relation Age of Onset  . Clotting disorder Mother   . Prostate cancer Father     Past Surgical History:  Procedure Laterality Date  . HERNIA REPAIR  2007  . PROSTATECTOMY  2006   Social History   Occupational History  . 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 Smoking status: Former Smoker    Packs/day: 0.30    Years: 3.00    Pack years: 0.90    Types: Cigarettes    Last attempt to quit: 04/02/1958    Years since quitting: 58.8  . Smokeless tobacco: Never Used  Substance and Sexual Activity  . Alcohol use: No  . Drug use: No  . Sexual activity: Not on file

## 2017-02-07 DIAGNOSIS — L299 Pruritus, unspecified: Secondary | ICD-10-CM | POA: Diagnosis not present

## 2017-02-07 DIAGNOSIS — L300 Nummular dermatitis: Secondary | ICD-10-CM | POA: Diagnosis not present

## 2017-02-25 DIAGNOSIS — I10 Essential (primary) hypertension: Secondary | ICD-10-CM | POA: Diagnosis not present

## 2017-02-25 DIAGNOSIS — Z0000 Encounter for general adult medical examination without abnormal findings: Secondary | ICD-10-CM | POA: Diagnosis not present

## 2017-02-25 DIAGNOSIS — Z1331 Encounter for screening for depression: Secondary | ICD-10-CM | POA: Diagnosis not present

## 2017-02-25 DIAGNOSIS — I4891 Unspecified atrial fibrillation: Secondary | ICD-10-CM | POA: Diagnosis not present

## 2017-02-25 DIAGNOSIS — Z79899 Other long term (current) drug therapy: Secondary | ICD-10-CM | POA: Diagnosis not present

## 2017-02-25 DIAGNOSIS — Z1211 Encounter for screening for malignant neoplasm of colon: Secondary | ICD-10-CM | POA: Diagnosis not present

## 2017-02-25 DIAGNOSIS — Z125 Encounter for screening for malignant neoplasm of prostate: Secondary | ICD-10-CM | POA: Diagnosis not present

## 2017-02-25 DIAGNOSIS — Z9181 History of falling: Secondary | ICD-10-CM | POA: Diagnosis not present

## 2017-02-25 DIAGNOSIS — G5620 Lesion of ulnar nerve, unspecified upper limb: Secondary | ICD-10-CM | POA: Diagnosis not present

## 2017-02-25 DIAGNOSIS — E785 Hyperlipidemia, unspecified: Secondary | ICD-10-CM | POA: Diagnosis not present

## 2017-03-04 DIAGNOSIS — G4733 Obstructive sleep apnea (adult) (pediatric): Secondary | ICD-10-CM | POA: Diagnosis not present

## 2017-03-14 DIAGNOSIS — E785 Hyperlipidemia, unspecified: Secondary | ICD-10-CM | POA: Diagnosis not present

## 2017-03-14 DIAGNOSIS — G5621 Lesion of ulnar nerve, right upper limb: Secondary | ICD-10-CM | POA: Diagnosis not present

## 2017-03-14 DIAGNOSIS — Z0000 Encounter for general adult medical examination without abnormal findings: Secondary | ICD-10-CM | POA: Diagnosis not present

## 2017-03-14 DIAGNOSIS — Z1389 Encounter for screening for other disorder: Secondary | ICD-10-CM | POA: Diagnosis not present

## 2017-03-14 DIAGNOSIS — I10 Essential (primary) hypertension: Secondary | ICD-10-CM | POA: Diagnosis not present

## 2017-04-15 DIAGNOSIS — I2510 Atherosclerotic heart disease of native coronary artery without angina pectoris: Secondary | ICD-10-CM | POA: Diagnosis not present

## 2017-04-15 DIAGNOSIS — I482 Chronic atrial fibrillation: Secondary | ICD-10-CM | POA: Diagnosis not present

## 2017-04-15 DIAGNOSIS — G4733 Obstructive sleep apnea (adult) (pediatric): Secondary | ICD-10-CM | POA: Diagnosis not present

## 2017-04-15 DIAGNOSIS — I10 Essential (primary) hypertension: Secondary | ICD-10-CM | POA: Diagnosis not present

## 2017-05-02 NOTE — Progress Notes (Signed)
Follow up appt sched for 07/2017

## 2017-05-31 DIAGNOSIS — L309 Dermatitis, unspecified: Secondary | ICD-10-CM | POA: Diagnosis not present

## 2017-05-31 DIAGNOSIS — R609 Edema, unspecified: Secondary | ICD-10-CM | POA: Diagnosis not present

## 2017-05-31 DIAGNOSIS — R233 Spontaneous ecchymoses: Secondary | ICD-10-CM | POA: Diagnosis not present

## 2017-05-31 DIAGNOSIS — Z1211 Encounter for screening for malignant neoplasm of colon: Secondary | ICD-10-CM | POA: Diagnosis not present

## 2017-06-07 DIAGNOSIS — R195 Other fecal abnormalities: Secondary | ICD-10-CM | POA: Diagnosis not present

## 2017-06-08 DIAGNOSIS — L299 Pruritus, unspecified: Secondary | ICD-10-CM | POA: Diagnosis not present

## 2017-06-08 DIAGNOSIS — I8310 Varicose veins of unspecified lower extremity with inflammation: Secondary | ICD-10-CM | POA: Diagnosis not present

## 2017-06-08 DIAGNOSIS — L570 Actinic keratosis: Secondary | ICD-10-CM | POA: Diagnosis not present

## 2017-06-08 DIAGNOSIS — L719 Rosacea, unspecified: Secondary | ICD-10-CM | POA: Diagnosis not present

## 2017-06-13 DIAGNOSIS — H1045 Other chronic allergic conjunctivitis: Secondary | ICD-10-CM | POA: Diagnosis not present

## 2017-07-01 DIAGNOSIS — H1045 Other chronic allergic conjunctivitis: Secondary | ICD-10-CM | POA: Diagnosis not present

## 2017-07-22 DIAGNOSIS — E785 Hyperlipidemia, unspecified: Secondary | ICD-10-CM | POA: Diagnosis not present

## 2017-07-22 DIAGNOSIS — K219 Gastro-esophageal reflux disease without esophagitis: Secondary | ICD-10-CM | POA: Diagnosis not present

## 2017-07-22 DIAGNOSIS — J449 Chronic obstructive pulmonary disease, unspecified: Secondary | ICD-10-CM | POA: Diagnosis not present

## 2017-07-22 DIAGNOSIS — I4891 Unspecified atrial fibrillation: Secondary | ICD-10-CM | POA: Diagnosis not present

## 2017-07-22 DIAGNOSIS — I10 Essential (primary) hypertension: Secondary | ICD-10-CM | POA: Diagnosis not present

## 2017-08-01 DIAGNOSIS — I482 Chronic atrial fibrillation: Secondary | ICD-10-CM | POA: Diagnosis not present

## 2017-08-01 DIAGNOSIS — R51 Headache: Secondary | ICD-10-CM | POA: Diagnosis not present

## 2017-08-01 DIAGNOSIS — G609 Hereditary and idiopathic neuropathy, unspecified: Secondary | ICD-10-CM | POA: Diagnosis not present

## 2017-08-07 ENCOUNTER — Ambulatory Visit (INDEPENDENT_AMBULATORY_CARE_PROVIDER_SITE_OTHER): Payer: PPO | Admitting: Specialist

## 2017-08-10 DIAGNOSIS — R51 Headache: Secondary | ICD-10-CM | POA: Diagnosis not present

## 2017-08-29 DIAGNOSIS — M4802 Spinal stenosis, cervical region: Secondary | ICD-10-CM | POA: Diagnosis not present

## 2017-08-29 DIAGNOSIS — R51 Headache: Secondary | ICD-10-CM | POA: Diagnosis not present

## 2017-08-29 DIAGNOSIS — G609 Hereditary and idiopathic neuropathy, unspecified: Secondary | ICD-10-CM | POA: Diagnosis not present

## 2017-08-29 DIAGNOSIS — G4733 Obstructive sleep apnea (adult) (pediatric): Secondary | ICD-10-CM | POA: Diagnosis not present

## 2017-09-12 DIAGNOSIS — G4733 Obstructive sleep apnea (adult) (pediatric): Secondary | ICD-10-CM | POA: Diagnosis not present

## 2017-09-18 ENCOUNTER — Ambulatory Visit (INDEPENDENT_AMBULATORY_CARE_PROVIDER_SITE_OTHER): Payer: PPO | Admitting: Specialist

## 2017-09-18 DIAGNOSIS — K219 Gastro-esophageal reflux disease without esophagitis: Secondary | ICD-10-CM | POA: Diagnosis not present

## 2017-09-18 DIAGNOSIS — K644 Residual hemorrhoidal skin tags: Secondary | ICD-10-CM | POA: Diagnosis not present

## 2017-09-19 DIAGNOSIS — W57XXXA Bitten or stung by nonvenomous insect and other nonvenomous arthropods, initial encounter: Secondary | ICD-10-CM | POA: Diagnosis not present

## 2017-09-19 DIAGNOSIS — R21 Rash and other nonspecific skin eruption: Secondary | ICD-10-CM | POA: Diagnosis not present

## 2017-09-24 DIAGNOSIS — Z1339 Encounter for screening examination for other mental health and behavioral disorders: Secondary | ICD-10-CM | POA: Diagnosis not present

## 2017-09-24 DIAGNOSIS — Z139 Encounter for screening, unspecified: Secondary | ICD-10-CM | POA: Diagnosis not present

## 2017-10-01 DIAGNOSIS — Z79899 Other long term (current) drug therapy: Secondary | ICD-10-CM | POA: Diagnosis not present

## 2017-10-01 DIAGNOSIS — E7800 Pure hypercholesterolemia, unspecified: Secondary | ICD-10-CM | POA: Diagnosis not present

## 2017-10-01 DIAGNOSIS — Z85828 Personal history of other malignant neoplasm of skin: Secondary | ICD-10-CM | POA: Diagnosis not present

## 2017-10-01 DIAGNOSIS — Z1211 Encounter for screening for malignant neoplasm of colon: Secondary | ICD-10-CM | POA: Diagnosis not present

## 2017-10-01 DIAGNOSIS — K629 Disease of anus and rectum, unspecified: Secondary | ICD-10-CM | POA: Diagnosis not present

## 2017-10-01 DIAGNOSIS — F329 Major depressive disorder, single episode, unspecified: Secondary | ICD-10-CM | POA: Diagnosis not present

## 2017-10-01 DIAGNOSIS — I10 Essential (primary) hypertension: Secondary | ICD-10-CM | POA: Diagnosis not present

## 2017-10-01 DIAGNOSIS — Z86010 Personal history of colonic polyps: Secondary | ICD-10-CM | POA: Diagnosis not present

## 2017-10-01 DIAGNOSIS — D126 Benign neoplasm of colon, unspecified: Secondary | ICD-10-CM | POA: Diagnosis not present

## 2017-10-01 DIAGNOSIS — Z7901 Long term (current) use of anticoagulants: Secondary | ICD-10-CM | POA: Diagnosis not present

## 2017-10-01 DIAGNOSIS — Z8546 Personal history of malignant neoplasm of prostate: Secondary | ICD-10-CM | POA: Diagnosis not present

## 2017-10-01 DIAGNOSIS — I4891 Unspecified atrial fibrillation: Secondary | ICD-10-CM | POA: Diagnosis not present

## 2017-10-01 DIAGNOSIS — K219 Gastro-esophageal reflux disease without esophagitis: Secondary | ICD-10-CM | POA: Diagnosis not present

## 2017-10-01 DIAGNOSIS — K6289 Other specified diseases of anus and rectum: Secondary | ICD-10-CM | POA: Diagnosis not present

## 2017-10-01 DIAGNOSIS — D124 Benign neoplasm of descending colon: Secondary | ICD-10-CM | POA: Diagnosis not present

## 2017-10-01 DIAGNOSIS — K635 Polyp of colon: Secondary | ICD-10-CM | POA: Diagnosis not present

## 2017-10-14 DIAGNOSIS — I2510 Atherosclerotic heart disease of native coronary artery without angina pectoris: Secondary | ICD-10-CM | POA: Diagnosis not present

## 2017-10-14 DIAGNOSIS — M4802 Spinal stenosis, cervical region: Secondary | ICD-10-CM | POA: Diagnosis not present

## 2017-10-14 DIAGNOSIS — I4891 Unspecified atrial fibrillation: Secondary | ICD-10-CM | POA: Diagnosis not present

## 2017-10-14 DIAGNOSIS — G609 Hereditary and idiopathic neuropathy, unspecified: Secondary | ICD-10-CM | POA: Diagnosis not present

## 2017-10-14 DIAGNOSIS — I10 Essential (primary) hypertension: Secondary | ICD-10-CM | POA: Diagnosis not present

## 2017-10-14 DIAGNOSIS — G4733 Obstructive sleep apnea (adult) (pediatric): Secondary | ICD-10-CM | POA: Diagnosis not present

## 2017-10-14 DIAGNOSIS — E785 Hyperlipidemia, unspecified: Secondary | ICD-10-CM | POA: Diagnosis not present

## 2017-10-14 DIAGNOSIS — I482 Chronic atrial fibrillation: Secondary | ICD-10-CM | POA: Diagnosis not present

## 2017-10-18 ENCOUNTER — Ambulatory Visit (INDEPENDENT_AMBULATORY_CARE_PROVIDER_SITE_OTHER): Payer: PPO | Admitting: Specialist

## 2017-10-31 ENCOUNTER — Ambulatory Visit (INDEPENDENT_AMBULATORY_CARE_PROVIDER_SITE_OTHER): Payer: PPO | Admitting: Specialist

## 2017-10-31 ENCOUNTER — Ambulatory Visit (INDEPENDENT_AMBULATORY_CARE_PROVIDER_SITE_OTHER): Payer: Self-pay

## 2017-10-31 ENCOUNTER — Encounter (INDEPENDENT_AMBULATORY_CARE_PROVIDER_SITE_OTHER): Payer: Self-pay | Admitting: Specialist

## 2017-10-31 VITALS — BP 112/79 | HR 104 | Ht 75.0 in | Wt 225.0 lb

## 2017-10-31 DIAGNOSIS — M222X1 Patellofemoral disorders, right knee: Secondary | ICD-10-CM

## 2017-10-31 DIAGNOSIS — M47812 Spondylosis without myelopathy or radiculopathy, cervical region: Secondary | ICD-10-CM

## 2017-10-31 DIAGNOSIS — G8929 Other chronic pain: Secondary | ICD-10-CM

## 2017-10-31 DIAGNOSIS — M25561 Pain in right knee: Secondary | ICD-10-CM

## 2017-10-31 DIAGNOSIS — M25562 Pain in left knee: Secondary | ICD-10-CM

## 2017-10-31 MED ORDER — METHYLPREDNISOLONE ACETATE 40 MG/ML IJ SUSP
40.0000 mg | INTRAMUSCULAR | Status: AC | PRN
  Administered 2017-10-31: 40 mg via INTRA_ARTICULAR

## 2017-10-31 MED ORDER — BUPIVACAINE HCL 0.25 % IJ SOLN
4.0000 mL | INTRAMUSCULAR | Status: AC | PRN
  Administered 2017-10-31: 4 mL via INTRA_ARTICULAR

## 2017-10-31 NOTE — Patient Instructions (Addendum)
Tylenol arthritis strength three times per day. Ice the knees 3 times per day. Terminal quadriceps exercises.  Knee is suffering from osteoarthritis, only real proven treatments are Weight loss, don't take NSIADs like diclofenac and  Do exercise. Well padded shoes help. Ice the knee 2-3 times a day 15-20 mins at a time.  CBD oil tablet or cream form may be of benefit.

## 2017-10-31 NOTE — Progress Notes (Addendum)
Office Visit Note   Patient: Jonathan Wiley           Date of Birth: 1946-01-01           MRN: [id number] Visit Date: 10/31/2017              Requested by: Melony Overly, MD [place], [place] [address] PCP: Nicoletta Dress, MD   Assessment & Plan: Visit Diagnoses:  1. Patellofemoral disorders, right knee   2. Chronic pain of both knees   3. Spondylosis without myelopathy or radiculopathy, cervical region     Plan: Tylenol arthritis strength three times per day. Ice the knees 3 times per day. Terminal quadriceps exercises.  Knee is suffering from osteoarthritis, only real proven treatments are Weight loss, don't take NSIADs like diclofenac and  Do exercise. Well padded shoes help. Ice the knee 2-3 times a day 15-20 mins at a time.  CBD oil tablet or cream form may be of benefit. Follow-Up Instructions: Return in about 3 months (around 01/31/2018).   Orders:  Orders Placed This Encounter  Procedures  . Large Joint Inj: bilateral knee  . XR Knee 1-2 Views Right  . XR Knee 1-2 Views Left   Meds ordered this encounter  Medications  . bupivacaine (MARCAINE) 0.25 % (with pres) injection 4 mL  . bupivacaine (MARCAINE) 0.25 % (with pres) injection 4 mL  . methylPREDNISolone acetate (DEPO-MEDROL) injection 40 mg  . methylPREDNISolone acetate (DEPO-MEDROL) injection 40 mg      Procedures: Large Joint Inj: bilateral knee on 10/31/2017 5:43 PM Indications: pain Details: 25 G 1.5 in needle, anterolateral approach  Arthrogram: No  Medications (Right): 4 mL bupivacaine 0.25 %; 40 mg methylPREDNISolone acetate 40 MG/ML Medications (Left): 4 mL bupivacaine 0.25 %; 40 mg methylPREDNISolone acetate 40 MG/ML Outcome: tolerated well, no immediate complications  Band aid applied. Procedure, treatment alternatives, risks and benefits explained, specific risks discussed. Consent was given by the patient. Immediately prior to procedure a time out was called to verify  the correct patient, procedure, equipment, support staff and site/side marked as required. Patient was prepped and draped in the usual sterile fashion.       Clinical Data: No additional findings.   Subjective: Chief Complaint  Patient presents with  . Neck - Follow-up    72 year old male with history of neck pain and right arm numbness. In the interval he has undergone right elbow cubital tunnel release. He reports the pain in the right little fingers is Better but there is some residual numbness. He is not having much neck pain but complains of bilateral knee pain. Stiffness in both knees and difficulty getting up from sitting and Getting out of the car. No leg numbness. He experiences pain in the anteromedial left knee and pain with prolong standing and walking for exercises.    Review of Systems  Constitutional: Negative.   HENT: Negative.   Eyes: Negative.   Respiratory: Negative.   Cardiovascular: Negative.   Gastrointestinal: Negative.   Endocrine: Negative.   Genitourinary: Negative.   Musculoskeletal: Negative.   Skin: Negative.   Allergic/Immunologic: Negative.   Neurological: Negative.   Hematological: Negative.   Psychiatric/Behavioral: Negative.      Objective: Vital Signs: BP 112/79 (BP Location: Left Arm, Patient Position: Sitting)   Pulse (!) 104   Ht 6\' 3"  (1.905 m)   Wt 225 lb (102.1 kg)   BMI 28.12 kg/m   Physical Exam  Ortho Exam  Specialty Comments:  No specialty comments available.  Imaging: No results found.   PMFS History: Patient Active Problem List   Diagnosis Date Noted  . Hypertension 01/31/2011  . SOB (shortness of breath) 01/30/2011   Past Medical History:  Diagnosis Date  . Atrial fibrillation ([place])   . Gallstones   . Hyperlipidemia   . Hypertension   . Prostate cancer [facility])     Family History  Problem Relation Age of Onset  . Clotting disorder Mother   . Prostate cancer Father     Past Surgical History:    Procedure Laterality Date  . HERNIA REPAIR  2007  . PROSTATECTOMY  2006   Social History   Occupational History  . 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 Smoking status: Former Smoker    Packs/day: 0.30    Years: 3.00    Pack years: 0.90    Types: Cigarettes    Last attempt to quit: 04/02/1958    Years since quitting: 59.6  . Smokeless tobacco: Never Used  Substance and Sexual Activity  . Alcohol use: No  . Drug use: No  . Sexual activity: Not on file

## 2017-11-12 DIAGNOSIS — L570 Actinic keratosis: Secondary | ICD-10-CM | POA: Diagnosis not present

## 2017-11-12 DIAGNOSIS — L821 Other seborrheic keratosis: Secondary | ICD-10-CM | POA: Diagnosis not present

## 2017-11-12 DIAGNOSIS — D485 Neoplasm of uncertain behavior of skin: Secondary | ICD-10-CM | POA: Diagnosis not present

## 2017-12-16 DIAGNOSIS — I482 Chronic atrial fibrillation: Secondary | ICD-10-CM | POA: Diagnosis not present

## 2017-12-16 DIAGNOSIS — I2510 Atherosclerotic heart disease of native coronary artery without angina pectoris: Secondary | ICD-10-CM | POA: Diagnosis not present

## 2017-12-31 DIAGNOSIS — G4733 Obstructive sleep apnea (adult) (pediatric): Secondary | ICD-10-CM | POA: Diagnosis not present

## 2017-12-31 DIAGNOSIS — I4891 Unspecified atrial fibrillation: Secondary | ICD-10-CM | POA: Diagnosis not present

## 2018-01-01 DIAGNOSIS — G609 Hereditary and idiopathic neuropathy, unspecified: Secondary | ICD-10-CM | POA: Diagnosis not present

## 2018-01-01 DIAGNOSIS — G4733 Obstructive sleep apnea (adult) (pediatric): Secondary | ICD-10-CM | POA: Diagnosis not present

## 2018-01-01 DIAGNOSIS — M4802 Spinal stenosis, cervical region: Secondary | ICD-10-CM | POA: Diagnosis not present

## 2018-01-22 DIAGNOSIS — Z23 Encounter for immunization: Secondary | ICD-10-CM | POA: Diagnosis not present

## 2018-02-03 ENCOUNTER — Ambulatory Visit (INDEPENDENT_AMBULATORY_CARE_PROVIDER_SITE_OTHER): Payer: PPO | Admitting: Specialist

## 2018-02-06 ENCOUNTER — Ambulatory Visit (INDEPENDENT_AMBULATORY_CARE_PROVIDER_SITE_OTHER): Payer: PPO | Admitting: Specialist

## 2018-02-06 ENCOUNTER — Encounter (INDEPENDENT_AMBULATORY_CARE_PROVIDER_SITE_OTHER): Payer: Self-pay | Admitting: Specialist

## 2018-02-06 VITALS — BP 105/80 | HR 93 | Ht 75.0 in | Wt 225.0 lb

## 2018-02-06 DIAGNOSIS — M4802 Spinal stenosis, cervical region: Secondary | ICD-10-CM

## 2018-02-06 DIAGNOSIS — M4722 Other spondylosis with radiculopathy, cervical region: Secondary | ICD-10-CM | POA: Diagnosis not present

## 2018-02-06 NOTE — Patient Instructions (Addendum)
Avoid overhead lifting and overhead use of the arms. Do not lift greater than 5-10 lbs. Adjust head rest in vehicle to prevent hyperextension if rear ended. Take extra precautions to avoid falling. Over the door cervical traction unit with 10-15 lbs for 10-`15 minutes 2-3 times per day for 2 weeks then as needed. Traction should be in slight flexion, manual for set up provided, also recommend the use of sand for play sand pit.

## 2018-02-06 NOTE — Progress Notes (Signed)
Office Visit Note   Patient: Jonathan Wiley           Date of Birth: 07-25-1945           MRN: [id number] Visit Date: 02/06/2018              Requested by: Nicoletta Dress, MD [place], [place] [address] PCP: Nicoletta Dress, MD   Assessment & Plan: Visit Diagnoses:  1. Other spondylosis with radiculopathy, cervical region   2. Spinal stenosis of cervical region     Plan: Avoid overhead lifting and overhead use of the arms. Do not lift greater than 5-10 lbs. Adjust head rest in vehicle to prevent hyperextension if rear ended. Take extra precautions to avoid falling. Over the door cervical traction unit with 10-15 lbs for 10-`15 minutes 2-3 times per day for 2 weeks then as needed. Traction should be in slight flexion, manual for set up provided, also recommend the use of sand for play sand pit.  Follow-Up Instructions: Return in about 6 months (around 08/07/2018).   Orders:  No orders of the defined types were placed in this encounter.  No orders of the defined types were placed in this encounter.     Procedures: No procedures performed   Clinical Data: No additional findings.   Subjective: Chief Complaint  Patient presents with  . Neck - Follow-up  . Right Knee - Follow-up  . Left Knee - Follow-up    72 year old right handed male with history of cervical spondylosis and cervical stenosis. He is having less pain and improved ROM of the neck and is using a soft cervical U collar when driving that improves his neck position and seems to be helping his overall neck and shoulder pain. No bowel or bladder difficulty. No leg pain, numbness or weakness. No falling out spelling.  No pain with cough or sneeze. No clumbsiness or loss of balance. Taking an occasional gabapentin for neck and shoulder pain. He reports he is able to drive better with the use of the soft c collar from [employer].    Review of Systems  Constitutional: Negative.   Negative for activity change, appetite change, chills, diaphoresis, fatigue, fever and unexpected weight change.  HENT: Negative.   Eyes: Negative.   Respiratory: Negative.   Cardiovascular: Negative.   Gastrointestinal: Negative.   Endocrine: Negative.   Genitourinary: Negative.   Musculoskeletal: Negative.   Skin: Negative.   Allergic/Immunologic: Negative.   Neurological: Negative.   Hematological: Negative.   Psychiatric/Behavioral: Negative.      Objective: Vital Signs: BP 105/80   Pulse 93   Ht 6\' 3"  (1.905 m)   Wt 225 lb (102.1 kg)   BMI 28.12 kg/m   Physical Exam  Constitutional: He is oriented to person, place, and time. He appears well-developed and well-nourished.  HENT:  Head: Normocephalic and atraumatic.  Eyes: Pupils are equal, round, and reactive to light. EOM are normal.  Neck: Normal range of motion. Neck supple.  Pulmonary/Chest: Effort normal and breath sounds normal.  Abdominal: Soft. Bowel sounds are normal.  Neurological: He is alert and oriented to person, place, and time.  Skin: Skin is warm and dry.  Psychiatric: He has a normal mood and affect. His behavior is normal. Judgment and thought content normal.    Back Exam   Tenderness  The patient is experiencing tenderness in the cervical.  Range of Motion  Extension:  60 abnormal  Lateral bend  right: 90  Lateral bend left:  60 abnormal  Rotation right: 90  Rotation left: 60   Muscle Strength  Right Quadriceps:  5/5  Left Quadriceps:  5/5  Right Hamstrings:  5/5  Left Hamstrings:  5/5   Tests  Straight leg raise right: negative Straight leg raise left: negative  Reflexes  Patellar: Hyporeflexic Achilles: Hyporeflexic Biceps: Hyporeflexic Babinski's sign: normal   Other  Toe walk: normal Heel walk: normal Sensation: normal Gait: normal  Erythema: no back redness Scars: absent  Comments:  Hoffman's sign is negative.       Specialty Comments:  No specialty comments  available.  Imaging: No results found.   PMFS History: Patient Active Problem List   Diagnosis Date Noted  . Hypertension 01/31/2011  . SOB (shortness of breath) 01/30/2011   Past Medical History:  Diagnosis Date  . Atrial fibrillation ([place])   . Gallstones   . Hyperlipidemia   . Hypertension   . Prostate cancer [facility])     Family History  Problem Relation Age of Onset  . Clotting disorder Mother   . Prostate cancer Father     Past Surgical History:  Procedure Laterality Date  . HERNIA REPAIR  2007  . PROSTATECTOMY  2006   Social History   Occupational History  . 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 Smoking status: Former Smoker    Packs/day: 0.30    Years: 3.00    Pack years: 0.90    Types: Cigarettes    Last attempt to quit: 04/02/1958    Years since quitting: 59.9  . Smokeless tobacco: Never Used  Substance and Sexual Activity  . Alcohol use: No  . Drug use: No  . Sexual activity: Not on file

## 2018-02-25 DIAGNOSIS — I4891 Unspecified atrial fibrillation: Secondary | ICD-10-CM | POA: Diagnosis not present

## 2018-02-25 DIAGNOSIS — R600 Localized edema: Secondary | ICD-10-CM | POA: Diagnosis not present

## 2018-02-25 DIAGNOSIS — Z125 Encounter for screening for malignant neoplasm of prostate: Secondary | ICD-10-CM | POA: Diagnosis not present

## 2018-02-25 DIAGNOSIS — I10 Essential (primary) hypertension: Secondary | ICD-10-CM | POA: Diagnosis not present

## 2018-02-25 DIAGNOSIS — Z79899 Other long term (current) drug therapy: Secondary | ICD-10-CM | POA: Diagnosis not present

## 2018-02-25 DIAGNOSIS — K219 Gastro-esophageal reflux disease without esophagitis: Secondary | ICD-10-CM | POA: Diagnosis not present

## 2018-02-25 DIAGNOSIS — J3089 Other allergic rhinitis: Secondary | ICD-10-CM | POA: Diagnosis not present

## 2018-02-25 DIAGNOSIS — E785 Hyperlipidemia, unspecified: Secondary | ICD-10-CM | POA: Diagnosis not present

## 2018-02-26 DIAGNOSIS — C61 Malignant neoplasm of prostate: Secondary | ICD-10-CM | POA: Diagnosis not present

## 2018-02-26 DIAGNOSIS — G609 Hereditary and idiopathic neuropathy, unspecified: Secondary | ICD-10-CM | POA: Diagnosis not present

## 2018-02-26 DIAGNOSIS — I4891 Unspecified atrial fibrillation: Secondary | ICD-10-CM | POA: Diagnosis not present

## 2018-02-26 DIAGNOSIS — I2510 Atherosclerotic heart disease of native coronary artery without angina pectoris: Secondary | ICD-10-CM | POA: Diagnosis not present

## 2018-02-26 DIAGNOSIS — M4802 Spinal stenosis, cervical region: Secondary | ICD-10-CM | POA: Diagnosis not present

## 2018-02-26 DIAGNOSIS — I10 Essential (primary) hypertension: Secondary | ICD-10-CM | POA: Diagnosis not present

## 2018-02-26 DIAGNOSIS — G4733 Obstructive sleep apnea (adult) (pediatric): Secondary | ICD-10-CM | POA: Diagnosis not present

## 2018-03-03 DIAGNOSIS — J208 Acute bronchitis due to other specified organisms: Secondary | ICD-10-CM | POA: Diagnosis not present

## 2018-03-18 DIAGNOSIS — Z136 Encounter for screening for cardiovascular disorders: Secondary | ICD-10-CM | POA: Diagnosis not present

## 2018-03-18 DIAGNOSIS — Z1331 Encounter for screening for depression: Secondary | ICD-10-CM | POA: Diagnosis not present

## 2018-03-18 DIAGNOSIS — E785 Hyperlipidemia, unspecified: Secondary | ICD-10-CM | POA: Diagnosis not present

## 2018-03-18 DIAGNOSIS — Z139 Encounter for screening, unspecified: Secondary | ICD-10-CM | POA: Diagnosis not present

## 2018-03-18 DIAGNOSIS — Z125 Encounter for screening for malignant neoplasm of prostate: Secondary | ICD-10-CM | POA: Diagnosis not present

## 2018-03-18 DIAGNOSIS — Z0000 Encounter for general adult medical examination without abnormal findings: Secondary | ICD-10-CM | POA: Diagnosis not present

## 2018-03-18 DIAGNOSIS — Z9181 History of falling: Secondary | ICD-10-CM | POA: Diagnosis not present

## 2018-07-15 ENCOUNTER — Emergency Department (HOSPITAL_COMMUNITY)
Admission: EM | Admit: 2018-07-15 | Discharge: 2018-07-15 | Disposition: A | Discharge status: Home / Self Care | Payer: PPO | Attending: Emergency Medicine | Admitting: Emergency Medicine

## 2018-07-15 ENCOUNTER — Other Ambulatory Visit: Payer: Self-pay

## 2018-07-15 ENCOUNTER — Emergency Department (HOSPITAL_COMMUNITY): Payer: PPO

## 2018-07-15 ENCOUNTER — Encounter (HOSPITAL_COMMUNITY): Payer: Self-pay | Admitting: Emergency Medicine

## 2018-07-15 DIAGNOSIS — Z7901 Long term (current) use of anticoagulants: Secondary | ICD-10-CM | POA: Insufficient documentation

## 2018-07-15 DIAGNOSIS — Z87891 Personal history of nicotine dependence: Secondary | ICD-10-CM | POA: Diagnosis not present

## 2018-07-15 DIAGNOSIS — Z8546 Personal history of malignant neoplasm of prostate: Secondary | ICD-10-CM | POA: Insufficient documentation

## 2018-07-15 DIAGNOSIS — N200 Calculus of kidney: Secondary | ICD-10-CM | POA: Diagnosis not present

## 2018-07-15 DIAGNOSIS — R109 Unspecified abdominal pain: Secondary | ICD-10-CM | POA: Diagnosis not present

## 2018-07-15 DIAGNOSIS — L570 Actinic keratosis: Secondary | ICD-10-CM | POA: Diagnosis not present

## 2018-07-15 DIAGNOSIS — I10 Essential (primary) hypertension: Secondary | ICD-10-CM | POA: Diagnosis not present

## 2018-07-15 DIAGNOSIS — Z79899 Other long term (current) drug therapy: Secondary | ICD-10-CM | POA: Insufficient documentation

## 2018-07-15 DIAGNOSIS — L821 Other seborrheic keratosis: Secondary | ICD-10-CM | POA: Diagnosis not present

## 2018-07-15 DIAGNOSIS — D1801 Hemangioma of skin and subcutaneous tissue: Secondary | ICD-10-CM | POA: Diagnosis not present

## 2018-07-15 LAB — CBC WITH DIFFERENTIAL/PLATELET
Abs Immature Granulocytes: 0.06 10*3/uL (ref 0.00–0.07)
Basophils Absolute: 0.1 10*3/uL (ref 0.0–0.1)
Basophils Relative: 1 %
Eosinophils Absolute: 0 10*3/uL (ref 0.0–0.5)
Eosinophils Relative: 0 %
HCT: 50.2 % (ref 39.0–52.0)
Hemoglobin: 16.5 g/dL (ref 13.0–17.0)
Immature Granulocytes: 1 %
Lymphocytes Relative: 15 %
Lymphs Abs: 1.7 10*3/uL (ref 0.7–4.0)
MCH: 31.4 pg (ref 26.0–34.0)
MCHC: 32.9 g/dL (ref 30.0–36.0)
MCV: 95.6 fL (ref 80.0–100.0)
Monocytes Absolute: 1 10*3/uL (ref 0.1–1.0)
Monocytes Relative: 8 %
Neutro Abs: 8.9 10*3/uL — ABNORMAL HIGH (ref 1.7–7.7)
Neutrophils Relative %: 75 %
Platelets: 216 10*3/uL (ref 150–400)
RBC: 5.25 MIL/uL (ref 4.22–5.81)
RDW: 12.5 % (ref 11.5–15.5)
WBC: 11.8 10*3/uL — ABNORMAL HIGH (ref 4.0–10.5)
nRBC: 0 % (ref 0.0–0.2)

## 2018-07-15 LAB — URINALYSIS, ROUTINE W REFLEX MICROSCOPIC
Bilirubin Urine: NEGATIVE
Glucose, UA: NEGATIVE mg/dL
Ketones, ur: NEGATIVE mg/dL
Leukocytes,Ua: NEGATIVE
Nitrite: NEGATIVE
Protein, ur: NEGATIVE mg/dL
RBC / HPF: >50 RBC/hpf — ABNORMAL HIGH (ref 0–5)
Specific Gravity, Urine: 1.016 (ref 1.005–1.030)
pH: 5 (ref 5.0–8.0)

## 2018-07-15 LAB — COMPREHENSIVE METABOLIC PANEL
ALT: 28 U/L (ref 0–44)
AST: 33 U/L (ref 15–41)
Albumin: 3.9 g/dL (ref 3.5–5.0)
Alkaline Phosphatase: 66 U/L (ref 38–126)
Anion gap: 11 (ref 5–15)
BUN: 13 mg/dL (ref 8–23)
CO2: 22 mmol/L (ref 22–32)
Calcium: 9.5 mg/dL (ref 8.9–10.3)
Chloride: 108 mmol/L (ref 98–111)
Creatinine, Ser: 1.4 mg/dL — ABNORMAL HIGH (ref 0.61–1.24)
GFR calc Af Amer: 57 mL/min — ABNORMAL LOW (ref >60)
GFR calc non Af Amer: 49 mL/min — ABNORMAL LOW (ref >60)
Glucose, Bld: 115 mg/dL — ABNORMAL HIGH (ref 70–99)
Potassium: 3.5 mmol/L (ref 3.5–5.1)
Sodium: 141 mmol/L (ref 135–145)
Total Bilirubin: 1.1 mg/dL (ref 0.3–1.2)
Total Protein: 7 g/dL (ref 6.5–8.1)

## 2018-07-15 MED ORDER — ACETAMINOPHEN 500 MG PO TABS
1000.0000 mg | ORAL_TABLET | Freq: Once | ORAL | Status: AC
  Administered 2018-07-15: 1000 mg via ORAL
  Filled 2018-07-15: qty 2

## 2018-07-15 MED ORDER — SODIUM CHLORIDE 0.9 % IV BOLUS
1000.0000 mL | Freq: Once | INTRAVENOUS | Status: AC
  Administered 2018-07-15: 1000 mL via INTRAVENOUS

## 2018-07-15 MED ORDER — ONDANSETRON HCL 4 MG/2ML IJ SOLN
4.0000 mg | Freq: Once | INTRAMUSCULAR | Status: AC
  Administered 2018-07-15: 4 mg via INTRAVENOUS
  Filled 2018-07-15 (×2): qty 2

## 2018-07-15 MED ORDER — FENTANYL CITRATE (PF) 100 MCG/2ML IJ SOLN
25.0000 ug | Freq: Once | INTRAMUSCULAR | Status: AC
  Administered 2018-07-15: 25 ug via INTRAVENOUS
  Filled 2018-07-15 (×2): qty 2

## 2018-07-15 MED ORDER — HYDROCODONE-ACETAMINOPHEN 5-325 MG PO TABS: 1.0000 | ORAL_TABLET | Freq: Four times a day (QID) | ORAL | 0 refills | Status: AC | PRN

## 2018-07-15 NOTE — Discharge Instructions (Signed)
As discussed, your evaluation today has been largely reassuring.  But, it is important that you monitor your condition carefully, and do not hesitate to return to the ED if you develop new, or concerning changes in your condition. ? ?Otherwise, please follow-up with your physician for appropriate ongoing care. ? ?

## 2018-07-15 NOTE — ED Provider Notes (Signed)
[facility] EMERGENCY DEPARTMENT Provider Note   CSN: [id number] Arrival date & time: 07/15/18  1156    History   Chief Complaint Chief Complaint  Patient presents with  . possible kidney stone    HPI Jonathan Wiley is a 73 y.o. male.     HPI Patient presents with abdominal pain, nausea. Patient has history of multiple medical issues including prostate cancer, A. fib, kidney stone. Patient takes anticoagulant and Flomax regularly. He was in his usual state of health until about 5 hours prior to my evaluation. About that time he developed right lower quadrant and right flank pain.  Pain is persistent, severe, sharp. There is associated nausea, but no vomiting. After an episode of lightheadedness, he was advised to come here for evaluation. Past Medical History:  Diagnosis Date  . Atrial fibrillation ([place])   . Gallstones   . Hyperlipidemia   . Hypertension   . Prostate cancer [facility])     Patient Active Problem List   Diagnosis Date Noted  . Hypertension 01/31/2011  . SOB (shortness of breath) 01/30/2011    Past Surgical History:  Procedure Laterality Date  . HERNIA REPAIR  2007  . PROSTATECTOMY  2006        Home Medications    Prior to Admission medications   Medication Sig Start Date End Date Taking? Authorizing Provider  Amlodipine-Valsartan-HCTZ (EXFORGE HCT) 5-160-12.5 MG TABS One daily Patient taking differently: Take 1 tablet by mouth daily at 12 noon.  01/30/11   Tanda Rockers, MD  famotidine (PEPCID) 20 MG tablet Take 20 mg by mouth daily.     [provider]  gabapentin (NEURONTIN) 100 MG capsule Take 100 mg by mouth 2 (two) times daily.  10/07/15   [provider]  losartan-hydrochlorothiazide (HYZAAR) 100-12.5 MG tablet Take 1 tablet by mouth daily.  09/18/16   [provider]  metoprolol tartrate (LOPRESSOR) 25 MG tablet  10/11/16   [provider]  montelukast (SINGULAIR) 10 MG tablet Take 10 mg  by mouth at bedtime.     [provider]  omeprazole (PRILOSEC) 20 MG capsule Take 20 mg by mouth daily.  09/19/16   [provider]  pravastatin (PRAVACHOL) 40 MG tablet Take 40 mg by mouth daily.  01/04/11   [provider]  rivaroxaban (XARELTO) 20 MG TABS tablet Take 20 mg by mouth daily with supper.  09/05/16   [provider]    Family History Family History  Problem Relation Age of Onset  . Clotting disorder Mother   . Prostate cancer Father     Social History Social History   Tobacco Use  . Smoking status: Former Smoker    Packs/day: 0.30    Years: 3.00    Pack years: 0.90    Types: Cigarettes    Last attempt to quit: 04/02/1958    Years since quitting: 60.3  . Smokeless tobacco: Never Used  Substance Use Topics  . Alcohol use: No  . Drug use: No     Allergies   Penicillin g and Tadalafil   Review of Systems Review of Systems  Constitutional:       Per HPI, otherwise negative  HENT:       Per HPI, otherwise negative  Respiratory:       Per HPI, otherwise negative  Cardiovascular:       Per HPI, otherwise negative  Gastrointestinal: Positive for abdominal pain and nausea. Negative for vomiting.  Endocrine:  Negative aside from HPI  Genitourinary:       Neg aside from HPI   Musculoskeletal:       Per HPI, otherwise negative  Skin: Negative.   Neurological: Negative for syncope.     Physical Exam Updated Vital Signs BP (!) 144/90 (BP Location: Right Arm)   Pulse 93   Temp 97.8 F (36.6 C) (Oral)   Resp (!) 22   Wt 102.1 kg   SpO2 95%   BMI 28.13 kg/m   Physical Exam Vitals signs and nursing note reviewed.  Constitutional:      General: He is not in acute distress.    Appearance: He is well-developed.  HENT:     Head: Normocephalic and atraumatic.  Eyes:     Conjunctiva/sclera: Conjunctivae normal.  Cardiovascular:     Rate and Rhythm: Normal rate and regular rhythm.  Pulmonary:     Effort:  Pulmonary effort is normal. No respiratory distress.     Breath sounds: No stridor.  Abdominal:     General: There is no distension.     Tenderness: There is abdominal tenderness.     Comments: Tenderness throughout the right mid and lower abdomen  Skin:    General: Skin is warm and dry.  Neurological:     Mental Status: He is alert and oriented to person, place, and time.      ED Treatments / Results  Labs (all labs ordered are listed, but only abnormal results are displayed) Labs Reviewed  URINALYSIS, ROUTINE W REFLEX MICROSCOPIC - Abnormal; Notable for the following components:      Result Value   APPearance HAZY (*)    Hgb urine dipstick LARGE (*)    RBC / HPF >50 (*)    Bacteria, UA RARE (*)    All other components within normal limits  COMPREHENSIVE METABOLIC PANEL - Abnormal; Notable for the following components:   Glucose, Bld 115 (*)    Creatinine, Ser 1.40 (*)    GFR calc non Af Amer 49 (*)    GFR calc Af Amer 57 (*)    All other components within normal limits  CBC WITH DIFFERENTIAL/PLATELET - Abnormal; Notable for the following components:   WBC 11.8 (*)    Neutro Abs 8.9 (*)    All other components within normal limits    Radiology Ct Renal Stone Study  Result Date: 07/15/2018 CLINICAL DATA:  Right flank pain. EXAM: CT ABDOMEN AND PELVIS WITHOUT CONTRAST TECHNIQUE: Multidetector CT imaging of the abdomen and pelvis was performed following the standard protocol without IV contrast. COMPARISON:  CT scan of Aug 21, 2015. FINDINGS: Lower chest: No acute abnormality. Hepatobiliary: Cholelithiasis is noted without inflammation. No biliary dilatation is noted. Liver is unremarkable on this unenhanced scan. Pancreas: Unremarkable. No pancreatic ductal dilatation or surrounding inflammatory changes. Spleen: Normal in size without focal abnormality. Adrenals/Urinary Tract: Adrenal glands appear normal. Left kidney and ureter are unremarkable. Urinary bladder appears normal.  Minimal right hydroureteronephrosis is noted with perinephric stranding, secondary to 5 mm calculus in distal right ureter. Small nonobstructive right renal calculus is noted as well. Stable mildly complex cyst seen involving inferior pole of right kidney with peripheral calcifications. Stomach/Bowel: Stomach is within normal limits. Appendix appears normal. No evidence of bowel wall thickening, distention, or inflammatory changes. Vascular/Lymphatic: Aortic atherosclerosis. No enlarged abdominal or pelvic lymph nodes. Reproductive: Status post prostatectomy. Other: No abdominal wall hernia or abnormality. No abdominopelvic ascites. Musculoskeletal: No acute or significant osseous findings. IMPRESSION: Minimal  right hydroureteronephrosis with perinephric stranding is noted secondary to 5 mm calculus in distal right ureter. Cholelithiasis without inflammation. Aortic Atherosclerosis (ICD10-I70.0). Electronically Signed   By: Marijo Conception M.D.   On: 07/15/2018 14:48    Procedures Procedures (including critical care time)  Medications Ordered in ED Medications  sodium chloride 0.9 % bolus 1,000 mL (1,000 mLs Intravenous New Bag/Given 07/15/18 1512)  fentaNYL (SUBLIMAZE) injection 25 mcg (25 mcg Intravenous Given 07/15/18 1511)  ondansetron (ZOFRAN) injection 4 mg (4 mg Intravenous Given 07/15/18 1511)     Initial Impression / Assessment and Plan / ED Course  I have reviewed the triage vital signs and the nursing notes.  Pertinent labs & imaging results that were available during my care of the patient were reviewed by me and considered in my medical decision making (see chart for details).        4:04 PM Pain minimal.  I reviewed labs, CT findings with the patient. CT reassuring, with demonstration of kidney stone, mild hydronephrosis, but no evidence for abscess, or other acute findings. Labs similarly reassuring aside from mild creatinine elevation consistent with mild dehydration. No  leukocytosis, urinalysis inconsistent with infection. With substantial improvement in pain, and after we discussed need to follow-up with urology, home monitoring, return precautions, the patient was discharged in stable condition.  Final Clinical Impressions(s) / ED Diagnoses   Final diagnoses:  None    ED Discharge Orders    None       Carmin Muskrat, MD 07/15/18 1636

## 2018-07-15 NOTE — ED Triage Notes (Signed)
Pt in from home with c/o R lower abd pain, radiates to R flank. Hx of kidney stones, pain is sharp and pt denies any fevers or pain with urination

## 2018-07-15 NOTE — ED Notes (Signed)
Patient verbalizes understanding of discharge instructions . Opportunity for questions and answers were provided . Armband removed by staff ,Pt discharged from ED. W/C  offered at D/C  and Declined W/C at D/C and was escorted to lobby by RN.  

## 2018-07-17 DIAGNOSIS — Z8546 Personal history of malignant neoplasm of prostate: Secondary | ICD-10-CM | POA: Diagnosis not present

## 2018-07-17 DIAGNOSIS — N200 Calculus of kidney: Secondary | ICD-10-CM | POA: Diagnosis not present

## 2018-07-17 DIAGNOSIS — I10 Essential (primary) hypertension: Secondary | ICD-10-CM | POA: Diagnosis not present

## 2018-07-17 DIAGNOSIS — I482 Chronic atrial fibrillation, unspecified: Secondary | ICD-10-CM | POA: Diagnosis not present

## 2018-07-17 DIAGNOSIS — Z6829 Body mass index (BMI) 29.0-29.9, adult: Secondary | ICD-10-CM | POA: Diagnosis not present

## 2018-07-17 DIAGNOSIS — R300 Dysuria: Secondary | ICD-10-CM | POA: Diagnosis not present

## 2018-08-07 ENCOUNTER — Ambulatory Visit: Payer: Self-pay | Admitting: Specialist

## 2018-08-27 DIAGNOSIS — G4730 Sleep apnea, unspecified: Secondary | ICD-10-CM | POA: Diagnosis not present

## 2018-08-27 DIAGNOSIS — K219 Gastro-esophageal reflux disease without esophagitis: Secondary | ICD-10-CM | POA: Diagnosis not present

## 2018-08-27 DIAGNOSIS — I480 Paroxysmal atrial fibrillation: Secondary | ICD-10-CM | POA: Diagnosis not present

## 2018-08-27 DIAGNOSIS — Z1331 Encounter for screening for depression: Secondary | ICD-10-CM | POA: Diagnosis not present

## 2018-08-27 DIAGNOSIS — E785 Hyperlipidemia, unspecified: Secondary | ICD-10-CM | POA: Diagnosis not present

## 2018-08-27 DIAGNOSIS — I482 Chronic atrial fibrillation, unspecified: Secondary | ICD-10-CM | POA: Diagnosis not present

## 2018-08-27 DIAGNOSIS — R739 Hyperglycemia, unspecified: Secondary | ICD-10-CM | POA: Diagnosis not present

## 2018-08-27 DIAGNOSIS — J3089 Other allergic rhinitis: Secondary | ICD-10-CM | POA: Diagnosis not present

## 2018-08-27 DIAGNOSIS — I10 Essential (primary) hypertension: Secondary | ICD-10-CM | POA: Diagnosis not present

## 2018-08-27 DIAGNOSIS — E663 Overweight: Secondary | ICD-10-CM | POA: Diagnosis not present

## 2018-08-27 DIAGNOSIS — Z79899 Other long term (current) drug therapy: Secondary | ICD-10-CM | POA: Diagnosis not present

## 2018-08-27 DIAGNOSIS — C61 Malignant neoplasm of prostate: Secondary | ICD-10-CM | POA: Diagnosis not present

## 2018-08-27 DIAGNOSIS — M4802 Spinal stenosis, cervical region: Secondary | ICD-10-CM | POA: Diagnosis not present

## 2018-08-27 DIAGNOSIS — I2510 Atherosclerotic heart disease of native coronary artery without angina pectoris: Secondary | ICD-10-CM | POA: Diagnosis not present

## 2018-11-12 ENCOUNTER — Ambulatory Visit: Payer: PPO | Admitting: Specialist

## 2018-12-19 DIAGNOSIS — R32 Unspecified urinary incontinence: Secondary | ICD-10-CM | POA: Diagnosis not present

## 2019-01-02 DIAGNOSIS — M4802 Spinal stenosis, cervical region: Secondary | ICD-10-CM | POA: Diagnosis not present

## 2019-01-02 DIAGNOSIS — G4730 Sleep apnea, unspecified: Secondary | ICD-10-CM | POA: Diagnosis not present

## 2019-01-14 DIAGNOSIS — Z23 Encounter for immunization: Secondary | ICD-10-CM | POA: Diagnosis not present

## 2019-01-16 ENCOUNTER — Ambulatory Visit: Payer: PPO | Admitting: Specialist

## 2019-01-27 DIAGNOSIS — L853 Xerosis cutis: Secondary | ICD-10-CM | POA: Diagnosis not present

## 2019-01-27 DIAGNOSIS — L821 Other seborrheic keratosis: Secondary | ICD-10-CM | POA: Diagnosis not present

## 2019-01-27 DIAGNOSIS — L719 Rosacea, unspecified: Secondary | ICD-10-CM | POA: Diagnosis not present

## 2019-01-27 DIAGNOSIS — L299 Pruritus, unspecified: Secondary | ICD-10-CM | POA: Diagnosis not present

## 2019-02-25 ENCOUNTER — Other Ambulatory Visit: Payer: Self-pay

## 2019-03-03 DIAGNOSIS — J449 Chronic obstructive pulmonary disease, unspecified: Secondary | ICD-10-CM | POA: Diagnosis not present

## 2019-03-03 DIAGNOSIS — L89209 Pressure ulcer of unspecified hip, unspecified stage: Secondary | ICD-10-CM | POA: Diagnosis not present

## 2019-03-03 DIAGNOSIS — Z79899 Other long term (current) drug therapy: Secondary | ICD-10-CM | POA: Diagnosis not present

## 2019-03-03 DIAGNOSIS — K219 Gastro-esophageal reflux disease without esophagitis: Secondary | ICD-10-CM | POA: Diagnosis not present

## 2019-03-03 DIAGNOSIS — E785 Hyperlipidemia, unspecified: Secondary | ICD-10-CM | POA: Diagnosis not present

## 2019-03-03 DIAGNOSIS — I10 Essential (primary) hypertension: Secondary | ICD-10-CM | POA: Diagnosis not present

## 2019-03-03 DIAGNOSIS — J3089 Other allergic rhinitis: Secondary | ICD-10-CM | POA: Diagnosis not present

## 2019-03-03 DIAGNOSIS — R739 Hyperglycemia, unspecified: Secondary | ICD-10-CM | POA: Diagnosis not present

## 2019-03-03 DIAGNOSIS — E663 Overweight: Secondary | ICD-10-CM | POA: Diagnosis not present

## 2019-03-03 DIAGNOSIS — I482 Chronic atrial fibrillation, unspecified: Secondary | ICD-10-CM | POA: Diagnosis not present

## 2019-03-23 DIAGNOSIS — Z9181 History of falling: Secondary | ICD-10-CM | POA: Diagnosis not present

## 2019-03-23 DIAGNOSIS — Z0000 Encounter for general adult medical examination without abnormal findings: Secondary | ICD-10-CM | POA: Diagnosis not present

## 2019-03-23 DIAGNOSIS — Z1331 Encounter for screening for depression: Secondary | ICD-10-CM | POA: Diagnosis not present

## 2019-03-23 DIAGNOSIS — Z139 Encounter for screening, unspecified: Secondary | ICD-10-CM | POA: Diagnosis not present

## 2019-03-23 DIAGNOSIS — E785 Hyperlipidemia, unspecified: Secondary | ICD-10-CM | POA: Diagnosis not present

## 2019-03-23 DIAGNOSIS — Z125 Encounter for screening for malignant neoplasm of prostate: Secondary | ICD-10-CM | POA: Diagnosis not present

## 2019-03-25 DIAGNOSIS — G4730 Sleep apnea, unspecified: Secondary | ICD-10-CM | POA: Diagnosis not present

## 2019-03-25 DIAGNOSIS — I480 Paroxysmal atrial fibrillation: Secondary | ICD-10-CM | POA: Diagnosis not present

## 2019-03-25 DIAGNOSIS — I2510 Atherosclerotic heart disease of native coronary artery without angina pectoris: Secondary | ICD-10-CM | POA: Diagnosis not present

## 2019-03-25 DIAGNOSIS — I10 Essential (primary) hypertension: Secondary | ICD-10-CM | POA: Diagnosis not present

## 2019-03-25 DIAGNOSIS — C61 Malignant neoplasm of prostate: Secondary | ICD-10-CM | POA: Diagnosis not present

## 2019-03-26 DIAGNOSIS — I445 Left posterior fascicular block: Secondary | ICD-10-CM | POA: Diagnosis not present

## 2019-03-26 DIAGNOSIS — I4891 Unspecified atrial fibrillation: Secondary | ICD-10-CM | POA: Diagnosis not present

## 2019-04-07 DIAGNOSIS — H26491 Other secondary cataract, right eye: Secondary | ICD-10-CM | POA: Diagnosis not present

## 2019-04-07 DIAGNOSIS — H11422 Conjunctival edema, left eye: Secondary | ICD-10-CM | POA: Diagnosis not present

## 2019-04-15 ENCOUNTER — Ambulatory Visit: Payer: PPO | Admitting: Specialist

## 2019-06-17 ENCOUNTER — Ambulatory Visit (INDEPENDENT_AMBULATORY_CARE_PROVIDER_SITE_OTHER): Payer: PPO | Admitting: Specialist

## 2019-06-17 ENCOUNTER — Ambulatory Visit (INDEPENDENT_AMBULATORY_CARE_PROVIDER_SITE_OTHER): Payer: PPO

## 2019-06-17 ENCOUNTER — Other Ambulatory Visit: Payer: Self-pay

## 2019-06-17 ENCOUNTER — Encounter: Payer: Self-pay | Admitting: Specialist

## 2019-06-17 VITALS — BP 123/78 | HR 75 | Ht 74.0 in | Wt 221.0 lb

## 2019-06-17 DIAGNOSIS — M25562 Pain in left knee: Secondary | ICD-10-CM | POA: Diagnosis not present

## 2019-06-17 DIAGNOSIS — M25561 Pain in right knee: Secondary | ICD-10-CM

## 2019-06-17 DIAGNOSIS — M1712 Unilateral primary osteoarthritis, left knee: Secondary | ICD-10-CM

## 2019-06-17 DIAGNOSIS — M4802 Spinal stenosis, cervical region: Secondary | ICD-10-CM | POA: Diagnosis not present

## 2019-06-17 DIAGNOSIS — G8929 Other chronic pain: Secondary | ICD-10-CM | POA: Diagnosis not present

## 2019-06-17 DIAGNOSIS — M1711 Unilateral primary osteoarthritis, right knee: Secondary | ICD-10-CM | POA: Diagnosis not present

## 2019-06-17 DIAGNOSIS — M47812 Spondylosis without myelopathy or radiculopathy, cervical region: Secondary | ICD-10-CM | POA: Diagnosis not present

## 2019-06-17 MED ORDER — METHYLPREDNISOLONE ACETATE 40 MG/ML IJ SUSP
40.0000 mg | INTRAMUSCULAR | Status: AC | PRN
  Administered 2019-06-17: 11:00:00 40 mg via INTRA_ARTICULAR

## 2019-06-17 MED ORDER — BUPIVACAINE HCL 0.25 % IJ SOLN
4.0000 mL | INTRAMUSCULAR | Status: AC | PRN
  Administered 2019-06-17: 11:00:00 4 mL via INTRA_ARTICULAR

## 2019-06-17 MED ORDER — DICLOFENAC SODIUM 1 % EX GEL: 4.0000 g | Freq: Four times a day (QID) | CUTANEOUS | 3 refills | Status: AC

## 2019-06-17 MED ORDER — TRAMADOL-ACETAMINOPHEN 37.5-325 MG PO TABS: 1.0000 | ORAL_TABLET | Freq: Four times a day (QID) | ORAL | 0 refills | Status: DC | PRN

## 2019-06-17 NOTE — Patient Instructions (Signed)
Plan: The main ways of treat osteoarthritis, that are found to be success. Weight loss helps to decrease pain. Exercise is important to maintaining cartilage and bone thickness and strengthening. You can not take oral NSAIDs like motrin, tylenol, alleve are meds decreasing the inflamation. Your cardiologist may allow the use of voltaren or diclofenac gel. Tylenol arthritis strength up to 3-4 tablet only per day, under 3000 mg per day of tylenol. Ice is okay  In afternoon and evening and hot shower in the am  Well padded shoes help. Ice the knee 2-3 times a day 15-20 mins at a time.-3 times a day 15-20 mins at a time. Hot showers in the AM.  Injection with steroid may be of benefit. Hemp CBD capsules, [employer] 5,000-7,000 mg per bottle, 60 capsules per bottle, take one capsule twice a day. Cane in the left hand to use with left leg weight bearing.

## 2019-06-17 NOTE — Progress Notes (Signed)
Office Visit Note   Patient: Jonathan Wiley           Date of Birth: 1945/11/04           MRN: [id number] Visit Date: 06/17/2019              Requested by: Nicoletta Dress, MD [address],  [place] [id number] PCP: Nicoletta Dress, MD   Assessment & Plan: Visit Diagnoses:  1. Chronic pain of both knees   2. Unilateral primary osteoarthritis, left knee   3. Unilateral primary osteoarthritis, right knee   4. Spinal stenosis of cervical region   5. Spondylosis without myelopathy or radiculopathy, cervical region     Plan: The main ways of treat osteoarthritis, that are found to be success. Weight loss helps to decrease pain. Exercise is important to maintaining cartilage and bone thickness and strengthening. You can not take oral NSAIDs like motrin, tylenol, alleve are meds decreasing the inflamation. Your cardiologist may allow the use of voltaren or diclofenac gel. Tylenol arthritis strength up to 3-4 tablet only per day, under 3000 mg per day of tylenol. Ice is okay  In afternoon and evening and hot shower in the am  Well padded shoes help. Ice the knee 2-3 times a day 15-20 mins at a time.-3 times a day 15-20 mins at a time. Hot showers in the AM.  Injection with steroid may be of benefit. Hemp CBD capsules, amazon[employer] 5,000-7,000 mg per bottle, 60 capsules per bottle, take one capsule twice a day. Cane in the left hand to use with left leg weight bearing. Follow-Up Instructions: No follow-ups on file.    Follow-Up Instructions: No follow-ups on file.   Orders:  Orders Placed This Encounter  Procedures  . Large Joint Inj: L knee  . XR KNEE 3 VIEW LEFT   No orders of the defined types were placed in this encounter.     Procedures: Large Joint Inj: L knee on 06/17/2019 11:20 AM Indications: pain Details: 25 G 1.5 in needle, anteromedial approach  Arthrogram: No  Medications: 40 mg methylPREDNISolone acetate 40 MG/ML; 4 mL bupivacaine  0.25 % Outcome: tolerated well, no immediate complications Procedure, treatment alternatives, risks and benefits explained, specific risks discussed. Consent was given by the patient. Immediately prior to procedure a time out was called to verify the correct patient, procedure, equipment, support staff and site/side marked as required. Patient was prepped and draped in the usual sterile fashion.       Clinical Data: No additional findings.   Subjective: Chief Complaint  Patient presents with  . Neck - Follow-up  . Left Knee - Pain    74 year old male with history of bilateral knee DJD, and cervical spondylosis, he has ongoing neck pain and stiffness and not doing traction. Overall he is having less Neck issues and more problems with left knee pain and stiffness, and decreased motion. Dressing with difficulty and with bending knee and lifting when dressing is More difficult, left greater than right knee. Pain in the left knee with standing and walking with prolong sitting and with getting up in the AM with starting pain. He gets better with Continuing to move after a few seconds.    Review of Systems  Constitutional: Negative.   HENT: Negative.   Eyes: Negative.   Respiratory: Negative.   Cardiovascular: Negative.   Gastrointestinal: Negative.   Endocrine: Negative.   Genitourinary: Negative.   Musculoskeletal: Negative.  Skin: Negative.   Allergic/Immunologic: Negative.   Neurological: Negative.   Hematological: Negative.   Psychiatric/Behavioral: Negative.      Objective: Vital Signs: BP 123/78 (BP Location: Left Arm, Patient Position: Sitting)   Pulse 75   Ht 6\' 2"  (1.88 m)   Wt 221 lb (100.2 kg)   BMI 28.37 kg/m   Physical Exam Constitutional:      Appearance: He is well-developed.  HENT:     Head: Normocephalic and atraumatic.  Eyes:     Pupils: Pupils are equal, round, and reactive to light.  Pulmonary:     Effort: Pulmonary effort is normal.      Breath sounds: Normal breath sounds.  Abdominal:     General: Bowel sounds are normal.     Palpations: Abdomen is soft.  Musculoskeletal:     Cervical back: Normal range of motion and neck supple.     Right knee: Effusion present.     Left knee:     Instability Tests: Negative anterior drawer test. Negative posterior drawer test. Positive medial McMurray test. Negative lateral McMurray test.  Skin:    General: Skin is warm and dry.  Neurological:     Mental Status: He is alert and oriented to person, place, and time.  Psychiatric:        Behavior: Behavior normal.        Thought Content: Thought content normal.        Judgment: Judgment normal.     Right Knee Exam  Right knee exam is normal.  Range of Motion  Extension: normal  Flexion: normal   Tests  Varus: positive  Lachman:  Anterior - negative    Posterior - negative  Other  Effusion: effusion present   Left Knee Exam   Muscle Strength  The patient has normal left knee strength.  Tenderness  The patient is experiencing tenderness in the medial joint line, medial retinaculum and patella.  Range of Motion  Extension:  -5 abnormal  Flexion: 120   Tests  McMurray:  Medial - positive Lateral - negative Varus: positive  Lachman:  Anterior - negative    Posterior - negative Drawer:  Anterior - negative     Posterior - negative  Other  Erythema: absent Scars: absent Sensation: normal Pulse: present Swelling: mild  Comments:  Tender right medial joint line.      Specialty Comments:  No specialty comments available.  Imaging: XR KNEE 3 VIEW LEFT  Result Date: 06/17/2019 AP standing bilateral knees, left lateral and sunrise patella radiographs compared with radiographs from 10/2017 show progression of medial joint line narrowing bilaterally with the left knee in neutral to 1 degree varus and right and the right at about 3 degrees    PMFS History: Patient Active Problem List   Diagnosis Date Noted   . Hypertension 01/31/2011  . SOB (shortness of breath) 01/30/2011   Past Medical History:  Diagnosis Date  . Atrial fibrillation ([place])   . Gallstones   . Hyperlipidemia   . Hypertension   . Prostate cancer [facility])     Family History  Problem Relation Age of Onset  . Clotting disorder Mother   . Prostate cancer Father     Past Surgical History:  Procedure Laterality Date  . HERNIA REPAIR  2007  . PROSTATECTOMY  2006   Social History   Occupational History  . 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 Smoking status: Former Smoker  Packs/day: 0.30    Years: 3.00    Pack years: 0.90    Types: Cigarettes    Quit date: 04/02/1958    Years since quitting: 61.2  . Smokeless tobacco: Never Used  Substance and Sexual Activity  . Alcohol use: No  . Drug use: No  . Sexual activity: Not on file

## 2019-07-28 DIAGNOSIS — L814 Other melanin hyperpigmentation: Secondary | ICD-10-CM | POA: Diagnosis not present

## 2019-07-28 DIAGNOSIS — L821 Other seborrheic keratosis: Secondary | ICD-10-CM | POA: Diagnosis not present

## 2019-07-28 DIAGNOSIS — C44311 Basal cell carcinoma of skin of nose: Secondary | ICD-10-CM | POA: Diagnosis not present

## 2019-08-20 DIAGNOSIS — S0502XA Injury of conjunctiva and corneal abrasion without foreign body, left eye, initial encounter: Secondary | ICD-10-CM | POA: Diagnosis not present

## 2019-08-27 DIAGNOSIS — S0502XD Injury of conjunctiva and corneal abrasion without foreign body, left eye, subsequent encounter: Secondary | ICD-10-CM | POA: Diagnosis not present

## 2019-09-01 DIAGNOSIS — J449 Chronic obstructive pulmonary disease, unspecified: Secondary | ICD-10-CM | POA: Diagnosis not present

## 2019-09-01 DIAGNOSIS — I10 Essential (primary) hypertension: Secondary | ICD-10-CM | POA: Diagnosis not present

## 2019-09-01 DIAGNOSIS — E785 Hyperlipidemia, unspecified: Secondary | ICD-10-CM | POA: Diagnosis not present

## 2019-09-01 DIAGNOSIS — R739 Hyperglycemia, unspecified: Secondary | ICD-10-CM | POA: Diagnosis not present

## 2019-09-01 DIAGNOSIS — I482 Chronic atrial fibrillation, unspecified: Secondary | ICD-10-CM | POA: Diagnosis not present

## 2019-09-01 DIAGNOSIS — K219 Gastro-esophageal reflux disease without esophagitis: Secondary | ICD-10-CM | POA: Diagnosis not present

## 2019-09-01 DIAGNOSIS — Z79899 Other long term (current) drug therapy: Secondary | ICD-10-CM | POA: Diagnosis not present

## 2019-09-17 ENCOUNTER — Ambulatory Visit: Payer: PPO | Admitting: Specialist

## 2019-10-14 DIAGNOSIS — I10 Essential (primary) hypertension: Secondary | ICD-10-CM | POA: Diagnosis not present

## 2019-10-14 DIAGNOSIS — I480 Paroxysmal atrial fibrillation: Secondary | ICD-10-CM | POA: Diagnosis not present

## 2019-10-14 DIAGNOSIS — I2510 Atherosclerotic heart disease of native coronary artery without angina pectoris: Secondary | ICD-10-CM | POA: Diagnosis not present

## 2019-10-14 DIAGNOSIS — G4730 Sleep apnea, unspecified: Secondary | ICD-10-CM | POA: Diagnosis not present

## 2019-10-27 ENCOUNTER — Other Ambulatory Visit: Payer: Self-pay

## 2019-11-16 ENCOUNTER — Ambulatory Visit: Payer: PPO | Admitting: Specialist

## 2020-01-05 DIAGNOSIS — L300 Nummular dermatitis: Secondary | ICD-10-CM | POA: Diagnosis not present

## 2020-01-05 DIAGNOSIS — L570 Actinic keratosis: Secondary | ICD-10-CM | POA: Diagnosis not present

## 2020-01-05 DIAGNOSIS — I480 Paroxysmal atrial fibrillation: Secondary | ICD-10-CM | POA: Diagnosis not present

## 2020-01-05 DIAGNOSIS — L299 Pruritus, unspecified: Secondary | ICD-10-CM | POA: Diagnosis not present

## 2020-01-05 DIAGNOSIS — G4730 Sleep apnea, unspecified: Secondary | ICD-10-CM | POA: Diagnosis not present

## 2020-01-22 DIAGNOSIS — Z23 Encounter for immunization: Secondary | ICD-10-CM | POA: Diagnosis not present

## 2020-02-10 ENCOUNTER — Ambulatory Visit: Payer: PPO | Admitting: Specialist

## 2020-02-17 ENCOUNTER — Ambulatory Visit: Payer: PPO | Admitting: Specialist

## 2020-03-02 DIAGNOSIS — I482 Chronic atrial fibrillation, unspecified: Secondary | ICD-10-CM | POA: Diagnosis not present

## 2020-03-02 DIAGNOSIS — R739 Hyperglycemia, unspecified: Secondary | ICD-10-CM | POA: Diagnosis not present

## 2020-03-02 DIAGNOSIS — I10 Essential (primary) hypertension: Secondary | ICD-10-CM | POA: Diagnosis not present

## 2020-03-02 DIAGNOSIS — E785 Hyperlipidemia, unspecified: Secondary | ICD-10-CM | POA: Diagnosis not present

## 2020-03-04 DIAGNOSIS — I482 Chronic atrial fibrillation, unspecified: Secondary | ICD-10-CM | POA: Diagnosis not present

## 2020-03-04 DIAGNOSIS — E785 Hyperlipidemia, unspecified: Secondary | ICD-10-CM | POA: Diagnosis not present

## 2020-03-04 DIAGNOSIS — I10 Essential (primary) hypertension: Secondary | ICD-10-CM | POA: Diagnosis not present

## 2020-03-04 DIAGNOSIS — J449 Chronic obstructive pulmonary disease, unspecified: Secondary | ICD-10-CM | POA: Diagnosis not present

## 2020-03-04 DIAGNOSIS — M1712 Unilateral primary osteoarthritis, left knee: Secondary | ICD-10-CM | POA: Diagnosis not present

## 2020-03-04 DIAGNOSIS — K219 Gastro-esophageal reflux disease without esophagitis: Secondary | ICD-10-CM | POA: Diagnosis not present

## 2020-03-14 DIAGNOSIS — M25551 Pain in right hip: Secondary | ICD-10-CM | POA: Diagnosis not present

## 2020-03-15 DIAGNOSIS — M48062 Spinal stenosis, lumbar region with neurogenic claudication: Secondary | ICD-10-CM | POA: Diagnosis not present

## 2020-03-15 DIAGNOSIS — M5136 Other intervertebral disc degeneration, lumbar region: Secondary | ICD-10-CM | POA: Diagnosis not present

## 2020-03-21 DIAGNOSIS — M6281 Muscle weakness (generalized): Secondary | ICD-10-CM | POA: Diagnosis not present

## 2020-03-21 DIAGNOSIS — M48062 Spinal stenosis, lumbar region with neurogenic claudication: Secondary | ICD-10-CM | POA: Diagnosis not present

## 2020-03-21 DIAGNOSIS — M25551 Pain in right hip: Secondary | ICD-10-CM | POA: Diagnosis not present

## 2020-03-21 DIAGNOSIS — M79651 Pain in right thigh: Secondary | ICD-10-CM | POA: Diagnosis not present

## 2020-03-21 DIAGNOSIS — R2689 Other abnormalities of gait and mobility: Secondary | ICD-10-CM | POA: Diagnosis not present

## 2020-03-23 DIAGNOSIS — Z9181 History of falling: Secondary | ICD-10-CM | POA: Diagnosis not present

## 2020-03-23 DIAGNOSIS — E785 Hyperlipidemia, unspecified: Secondary | ICD-10-CM | POA: Diagnosis not present

## 2020-03-23 DIAGNOSIS — Z0000 Encounter for general adult medical examination without abnormal findings: Secondary | ICD-10-CM | POA: Diagnosis not present

## 2020-03-23 DIAGNOSIS — Z1331 Encounter for screening for depression: Secondary | ICD-10-CM | POA: Diagnosis not present

## 2020-03-24 DIAGNOSIS — M48062 Spinal stenosis, lumbar region with neurogenic claudication: Secondary | ICD-10-CM | POA: Diagnosis not present

## 2020-03-24 DIAGNOSIS — M25551 Pain in right hip: Secondary | ICD-10-CM | POA: Diagnosis not present

## 2020-03-24 DIAGNOSIS — R2689 Other abnormalities of gait and mobility: Secondary | ICD-10-CM | POA: Diagnosis not present

## 2020-03-24 DIAGNOSIS — M6281 Muscle weakness (generalized): Secondary | ICD-10-CM | POA: Diagnosis not present

## 2020-03-24 DIAGNOSIS — M79651 Pain in right thigh: Secondary | ICD-10-CM | POA: Diagnosis not present

## 2020-03-28 DIAGNOSIS — M48062 Spinal stenosis, lumbar region with neurogenic claudication: Secondary | ICD-10-CM | POA: Diagnosis not present

## 2020-03-28 DIAGNOSIS — R2689 Other abnormalities of gait and mobility: Secondary | ICD-10-CM | POA: Diagnosis not present

## 2020-03-28 DIAGNOSIS — M6281 Muscle weakness (generalized): Secondary | ICD-10-CM | POA: Diagnosis not present

## 2020-03-28 DIAGNOSIS — M79651 Pain in right thigh: Secondary | ICD-10-CM | POA: Diagnosis not present

## 2020-03-28 DIAGNOSIS — M25551 Pain in right hip: Secondary | ICD-10-CM | POA: Diagnosis not present

## 2020-03-29 DIAGNOSIS — R2689 Other abnormalities of gait and mobility: Secondary | ICD-10-CM | POA: Diagnosis not present

## 2020-03-29 DIAGNOSIS — M6281 Muscle weakness (generalized): Secondary | ICD-10-CM | POA: Diagnosis not present

## 2020-03-29 DIAGNOSIS — M25551 Pain in right hip: Secondary | ICD-10-CM | POA: Diagnosis not present

## 2020-03-29 DIAGNOSIS — M79651 Pain in right thigh: Secondary | ICD-10-CM | POA: Diagnosis not present

## 2020-03-29 DIAGNOSIS — M48062 Spinal stenosis, lumbar region with neurogenic claudication: Secondary | ICD-10-CM | POA: Diagnosis not present

## 2020-04-04 DIAGNOSIS — M6281 Muscle weakness (generalized): Secondary | ICD-10-CM | POA: Diagnosis not present

## 2020-04-04 DIAGNOSIS — M25551 Pain in right hip: Secondary | ICD-10-CM | POA: Diagnosis not present

## 2020-04-04 DIAGNOSIS — R2689 Other abnormalities of gait and mobility: Secondary | ICD-10-CM | POA: Diagnosis not present

## 2020-04-04 DIAGNOSIS — M48062 Spinal stenosis, lumbar region with neurogenic claudication: Secondary | ICD-10-CM | POA: Diagnosis not present

## 2020-04-04 DIAGNOSIS — M79651 Pain in right thigh: Secondary | ICD-10-CM | POA: Diagnosis not present

## 2020-04-06 DIAGNOSIS — M79651 Pain in right thigh: Secondary | ICD-10-CM | POA: Diagnosis not present

## 2020-04-06 DIAGNOSIS — M6281 Muscle weakness (generalized): Secondary | ICD-10-CM | POA: Diagnosis not present

## 2020-04-06 DIAGNOSIS — M25551 Pain in right hip: Secondary | ICD-10-CM | POA: Diagnosis not present

## 2020-04-06 DIAGNOSIS — M48062 Spinal stenosis, lumbar region with neurogenic claudication: Secondary | ICD-10-CM | POA: Diagnosis not present

## 2020-04-06 DIAGNOSIS — R2689 Other abnormalities of gait and mobility: Secondary | ICD-10-CM | POA: Diagnosis not present

## 2020-04-12 DIAGNOSIS — M48062 Spinal stenosis, lumbar region with neurogenic claudication: Secondary | ICD-10-CM | POA: Diagnosis not present

## 2020-04-12 DIAGNOSIS — M25551 Pain in right hip: Secondary | ICD-10-CM | POA: Diagnosis not present

## 2020-04-12 DIAGNOSIS — R2689 Other abnormalities of gait and mobility: Secondary | ICD-10-CM | POA: Diagnosis not present

## 2020-04-12 DIAGNOSIS — M79651 Pain in right thigh: Secondary | ICD-10-CM | POA: Diagnosis not present

## 2020-04-12 DIAGNOSIS — M6281 Muscle weakness (generalized): Secondary | ICD-10-CM | POA: Diagnosis not present

## 2020-04-14 DIAGNOSIS — M79651 Pain in right thigh: Secondary | ICD-10-CM | POA: Diagnosis not present

## 2020-04-14 DIAGNOSIS — M25551 Pain in right hip: Secondary | ICD-10-CM | POA: Diagnosis not present

## 2020-04-14 DIAGNOSIS — M6281 Muscle weakness (generalized): Secondary | ICD-10-CM | POA: Diagnosis not present

## 2020-04-14 DIAGNOSIS — M48062 Spinal stenosis, lumbar region with neurogenic claudication: Secondary | ICD-10-CM | POA: Diagnosis not present

## 2020-04-14 DIAGNOSIS — R2689 Other abnormalities of gait and mobility: Secondary | ICD-10-CM | POA: Diagnosis not present

## 2020-04-19 DIAGNOSIS — M79651 Pain in right thigh: Secondary | ICD-10-CM | POA: Diagnosis not present

## 2020-04-19 DIAGNOSIS — M6281 Muscle weakness (generalized): Secondary | ICD-10-CM | POA: Diagnosis not present

## 2020-04-19 DIAGNOSIS — R2689 Other abnormalities of gait and mobility: Secondary | ICD-10-CM | POA: Diagnosis not present

## 2020-04-19 DIAGNOSIS — M48062 Spinal stenosis, lumbar region with neurogenic claudication: Secondary | ICD-10-CM | POA: Diagnosis not present

## 2020-04-19 DIAGNOSIS — M25551 Pain in right hip: Secondary | ICD-10-CM | POA: Diagnosis not present

## 2020-04-21 DIAGNOSIS — M25551 Pain in right hip: Secondary | ICD-10-CM | POA: Diagnosis not present

## 2020-04-21 DIAGNOSIS — M48062 Spinal stenosis, lumbar region with neurogenic claudication: Secondary | ICD-10-CM | POA: Diagnosis not present

## 2020-04-21 DIAGNOSIS — M6281 Muscle weakness (generalized): Secondary | ICD-10-CM | POA: Diagnosis not present

## 2020-04-21 DIAGNOSIS — R2689 Other abnormalities of gait and mobility: Secondary | ICD-10-CM | POA: Diagnosis not present

## 2020-04-21 DIAGNOSIS — M79651 Pain in right thigh: Secondary | ICD-10-CM | POA: Diagnosis not present

## 2020-04-26 DIAGNOSIS — M25551 Pain in right hip: Secondary | ICD-10-CM | POA: Diagnosis not present

## 2020-04-26 DIAGNOSIS — R2689 Other abnormalities of gait and mobility: Secondary | ICD-10-CM | POA: Diagnosis not present

## 2020-04-26 DIAGNOSIS — M48062 Spinal stenosis, lumbar region with neurogenic claudication: Secondary | ICD-10-CM | POA: Diagnosis not present

## 2020-04-26 DIAGNOSIS — M79651 Pain in right thigh: Secondary | ICD-10-CM | POA: Diagnosis not present

## 2020-04-26 DIAGNOSIS — M5136 Other intervertebral disc degeneration, lumbar region: Secondary | ICD-10-CM | POA: Diagnosis not present

## 2020-04-26 DIAGNOSIS — M6281 Muscle weakness (generalized): Secondary | ICD-10-CM | POA: Diagnosis not present

## 2020-04-28 DIAGNOSIS — M25551 Pain in right hip: Secondary | ICD-10-CM | POA: Diagnosis not present

## 2020-04-28 DIAGNOSIS — R2689 Other abnormalities of gait and mobility: Secondary | ICD-10-CM | POA: Diagnosis not present

## 2020-04-28 DIAGNOSIS — M79651 Pain in right thigh: Secondary | ICD-10-CM | POA: Diagnosis not present

## 2020-04-28 DIAGNOSIS — M6281 Muscle weakness (generalized): Secondary | ICD-10-CM | POA: Diagnosis not present

## 2020-04-28 DIAGNOSIS — M48062 Spinal stenosis, lumbar region with neurogenic claudication: Secondary | ICD-10-CM | POA: Diagnosis not present

## 2020-05-03 DIAGNOSIS — M79651 Pain in right thigh: Secondary | ICD-10-CM | POA: Diagnosis not present

## 2020-05-03 DIAGNOSIS — R2689 Other abnormalities of gait and mobility: Secondary | ICD-10-CM | POA: Diagnosis not present

## 2020-05-03 DIAGNOSIS — M6281 Muscle weakness (generalized): Secondary | ICD-10-CM | POA: Diagnosis not present

## 2020-05-03 DIAGNOSIS — M48062 Spinal stenosis, lumbar region with neurogenic claudication: Secondary | ICD-10-CM | POA: Diagnosis not present

## 2020-05-03 DIAGNOSIS — M25551 Pain in right hip: Secondary | ICD-10-CM | POA: Diagnosis not present

## 2020-05-05 DIAGNOSIS — M79651 Pain in right thigh: Secondary | ICD-10-CM | POA: Diagnosis not present

## 2020-05-05 DIAGNOSIS — R2689 Other abnormalities of gait and mobility: Secondary | ICD-10-CM | POA: Diagnosis not present

## 2020-05-05 DIAGNOSIS — M25551 Pain in right hip: Secondary | ICD-10-CM | POA: Diagnosis not present

## 2020-05-05 DIAGNOSIS — M6281 Muscle weakness (generalized): Secondary | ICD-10-CM | POA: Diagnosis not present

## 2020-05-05 DIAGNOSIS — M48062 Spinal stenosis, lumbar region with neurogenic claudication: Secondary | ICD-10-CM | POA: Diagnosis not present

## 2020-05-09 DIAGNOSIS — M25551 Pain in right hip: Secondary | ICD-10-CM | POA: Diagnosis not present

## 2020-05-09 DIAGNOSIS — R2689 Other abnormalities of gait and mobility: Secondary | ICD-10-CM | POA: Diagnosis not present

## 2020-05-09 DIAGNOSIS — M6281 Muscle weakness (generalized): Secondary | ICD-10-CM | POA: Diagnosis not present

## 2020-05-09 DIAGNOSIS — M79651 Pain in right thigh: Secondary | ICD-10-CM | POA: Diagnosis not present

## 2020-05-09 DIAGNOSIS — M48062 Spinal stenosis, lumbar region with neurogenic claudication: Secondary | ICD-10-CM | POA: Diagnosis not present

## 2020-05-12 DIAGNOSIS — M6281 Muscle weakness (generalized): Secondary | ICD-10-CM | POA: Diagnosis not present

## 2020-05-12 DIAGNOSIS — R2689 Other abnormalities of gait and mobility: Secondary | ICD-10-CM | POA: Diagnosis not present

## 2020-05-12 DIAGNOSIS — M25551 Pain in right hip: Secondary | ICD-10-CM | POA: Diagnosis not present

## 2020-05-12 DIAGNOSIS — M79651 Pain in right thigh: Secondary | ICD-10-CM | POA: Diagnosis not present

## 2020-05-12 DIAGNOSIS — M48062 Spinal stenosis, lumbar region with neurogenic claudication: Secondary | ICD-10-CM | POA: Diagnosis not present

## 2020-05-16 DIAGNOSIS — M25422 Effusion, left elbow: Secondary | ICD-10-CM | POA: Diagnosis not present

## 2020-05-16 DIAGNOSIS — R2689 Other abnormalities of gait and mobility: Secondary | ICD-10-CM | POA: Diagnosis not present

## 2020-05-16 DIAGNOSIS — M6281 Muscle weakness (generalized): Secondary | ICD-10-CM | POA: Diagnosis not present

## 2020-05-16 DIAGNOSIS — M48062 Spinal stenosis, lumbar region with neurogenic claudication: Secondary | ICD-10-CM | POA: Diagnosis not present

## 2020-05-16 DIAGNOSIS — M25551 Pain in right hip: Secondary | ICD-10-CM | POA: Diagnosis not present

## 2020-05-16 DIAGNOSIS — M79651 Pain in right thigh: Secondary | ICD-10-CM | POA: Diagnosis not present

## 2020-05-18 ENCOUNTER — Ambulatory Visit: Payer: PPO | Admitting: Specialist

## 2020-05-19 DIAGNOSIS — M79651 Pain in right thigh: Secondary | ICD-10-CM | POA: Diagnosis not present

## 2020-05-19 DIAGNOSIS — M25551 Pain in right hip: Secondary | ICD-10-CM | POA: Diagnosis not present

## 2020-05-19 DIAGNOSIS — R2689 Other abnormalities of gait and mobility: Secondary | ICD-10-CM | POA: Diagnosis not present

## 2020-05-19 DIAGNOSIS — M48062 Spinal stenosis, lumbar region with neurogenic claudication: Secondary | ICD-10-CM | POA: Diagnosis not present

## 2020-05-19 DIAGNOSIS — M6281 Muscle weakness (generalized): Secondary | ICD-10-CM | POA: Diagnosis not present

## 2020-05-24 DIAGNOSIS — R2689 Other abnormalities of gait and mobility: Secondary | ICD-10-CM | POA: Diagnosis not present

## 2020-05-24 DIAGNOSIS — M79651 Pain in right thigh: Secondary | ICD-10-CM | POA: Diagnosis not present

## 2020-05-24 DIAGNOSIS — M48062 Spinal stenosis, lumbar region with neurogenic claudication: Secondary | ICD-10-CM | POA: Diagnosis not present

## 2020-05-24 DIAGNOSIS — M6281 Muscle weakness (generalized): Secondary | ICD-10-CM | POA: Diagnosis not present

## 2020-05-24 DIAGNOSIS — M25551 Pain in right hip: Secondary | ICD-10-CM | POA: Diagnosis not present

## 2020-05-26 DIAGNOSIS — R2689 Other abnormalities of gait and mobility: Secondary | ICD-10-CM | POA: Diagnosis not present

## 2020-05-26 DIAGNOSIS — M6281 Muscle weakness (generalized): Secondary | ICD-10-CM | POA: Diagnosis not present

## 2020-05-26 DIAGNOSIS — M48062 Spinal stenosis, lumbar region with neurogenic claudication: Secondary | ICD-10-CM | POA: Diagnosis not present

## 2020-05-26 DIAGNOSIS — M25551 Pain in right hip: Secondary | ICD-10-CM | POA: Diagnosis not present

## 2020-05-26 DIAGNOSIS — M79651 Pain in right thigh: Secondary | ICD-10-CM | POA: Diagnosis not present

## 2020-05-31 DIAGNOSIS — M48062 Spinal stenosis, lumbar region with neurogenic claudication: Secondary | ICD-10-CM | POA: Diagnosis not present

## 2020-05-31 DIAGNOSIS — M79651 Pain in right thigh: Secondary | ICD-10-CM | POA: Diagnosis not present

## 2020-05-31 DIAGNOSIS — M25551 Pain in right hip: Secondary | ICD-10-CM | POA: Diagnosis not present

## 2020-05-31 DIAGNOSIS — R2689 Other abnormalities of gait and mobility: Secondary | ICD-10-CM | POA: Diagnosis not present

## 2020-05-31 DIAGNOSIS — M6281 Muscle weakness (generalized): Secondary | ICD-10-CM | POA: Diagnosis not present

## 2020-06-07 DIAGNOSIS — M25551 Pain in right hip: Secondary | ICD-10-CM | POA: Diagnosis not present

## 2020-06-07 DIAGNOSIS — M6281 Muscle weakness (generalized): Secondary | ICD-10-CM | POA: Diagnosis not present

## 2020-06-07 DIAGNOSIS — R2689 Other abnormalities of gait and mobility: Secondary | ICD-10-CM | POA: Diagnosis not present

## 2020-06-07 DIAGNOSIS — M79651 Pain in right thigh: Secondary | ICD-10-CM | POA: Diagnosis not present

## 2020-06-07 DIAGNOSIS — M48062 Spinal stenosis, lumbar region with neurogenic claudication: Secondary | ICD-10-CM | POA: Diagnosis not present

## 2020-06-08 DIAGNOSIS — I10 Essential (primary) hypertension: Secondary | ICD-10-CM | POA: Diagnosis not present

## 2020-06-08 DIAGNOSIS — I480 Paroxysmal atrial fibrillation: Secondary | ICD-10-CM | POA: Diagnosis not present

## 2020-06-08 DIAGNOSIS — G609 Hereditary and idiopathic neuropathy, unspecified: Secondary | ICD-10-CM | POA: Diagnosis not present

## 2020-06-08 DIAGNOSIS — E785 Hyperlipidemia, unspecified: Secondary | ICD-10-CM | POA: Diagnosis not present

## 2020-06-09 DIAGNOSIS — M79651 Pain in right thigh: Secondary | ICD-10-CM | POA: Diagnosis not present

## 2020-06-09 DIAGNOSIS — M48062 Spinal stenosis, lumbar region with neurogenic claudication: Secondary | ICD-10-CM | POA: Diagnosis not present

## 2020-06-09 DIAGNOSIS — M6281 Muscle weakness (generalized): Secondary | ICD-10-CM | POA: Diagnosis not present

## 2020-06-09 DIAGNOSIS — R2689 Other abnormalities of gait and mobility: Secondary | ICD-10-CM | POA: Diagnosis not present

## 2020-06-09 DIAGNOSIS — M25551 Pain in right hip: Secondary | ICD-10-CM | POA: Diagnosis not present

## 2020-07-17 IMAGING — CT CT RENAL STONE PROTOCOL
2 of 4 series · 16 of 46 positions shown, 18 images · non-contrast
Comparison: CT scan of August 21, 2015.

CLINICAL DATA: Right flank pain.

EXAM:
CT ABDOMEN AND PELVIS WITHOUT CONTRAST
TECHNIQUE: Multidetector CT imaging of the abdomen and pelvis was performed
following the standard protocol without IV contrast.

[Series 3: ap without · axial · non-contrast · 0.80mm/px · z∈[+940,+1406]mm · 13 of 107 slices shown, 15 images]
[im 7/107  soft-tissue]
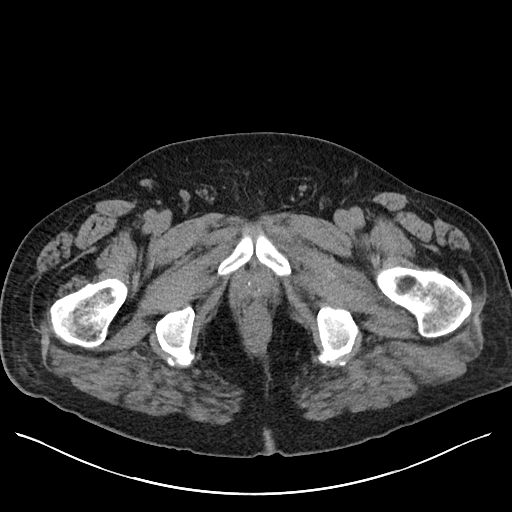
[im 7/107  bone]
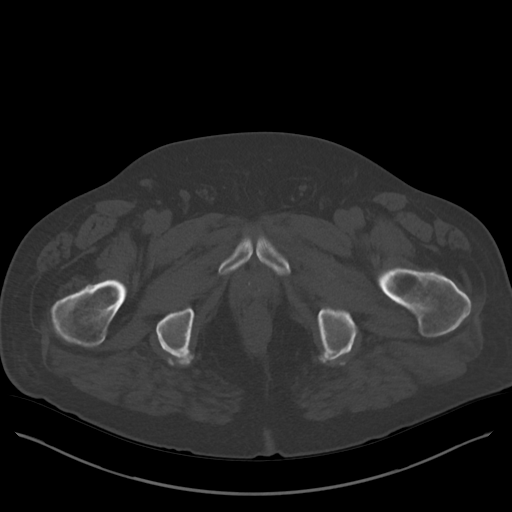
[im 13/107  soft-tissue]
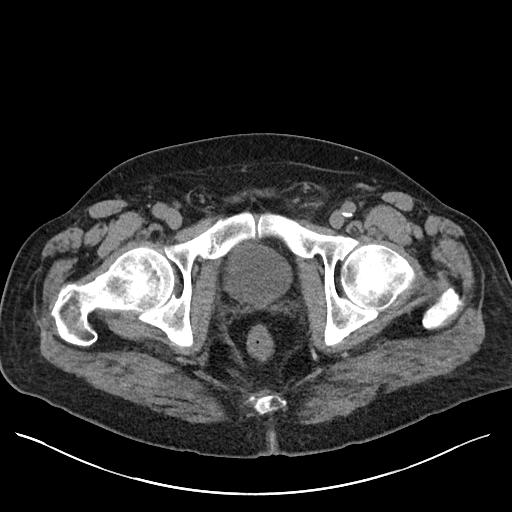
[im 25/107  soft-tissue]
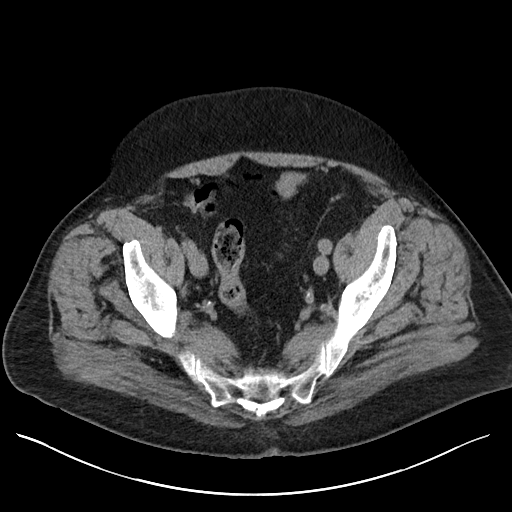
[im 32/107  soft-tissue]
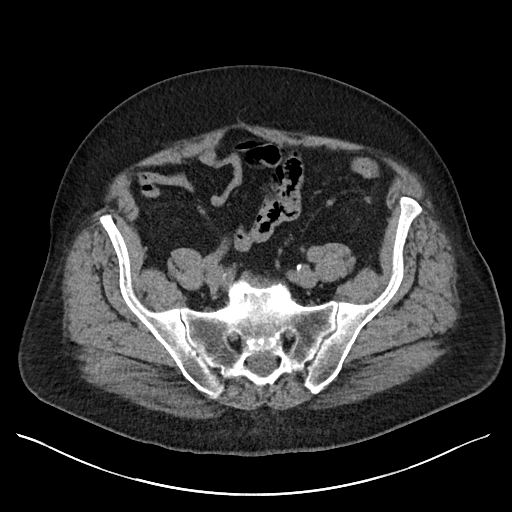
[im 38/107  soft-tissue]
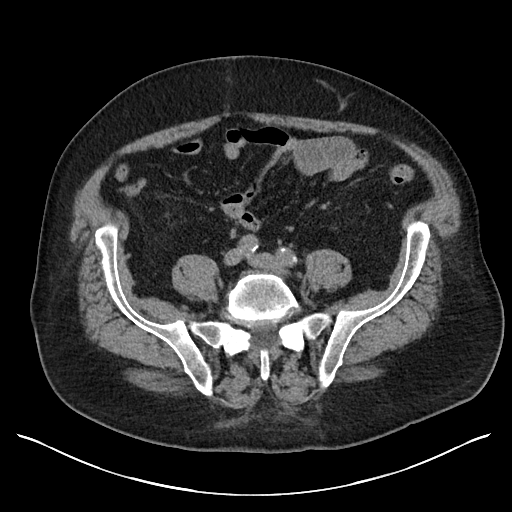
[im 44/107  soft-tissue]
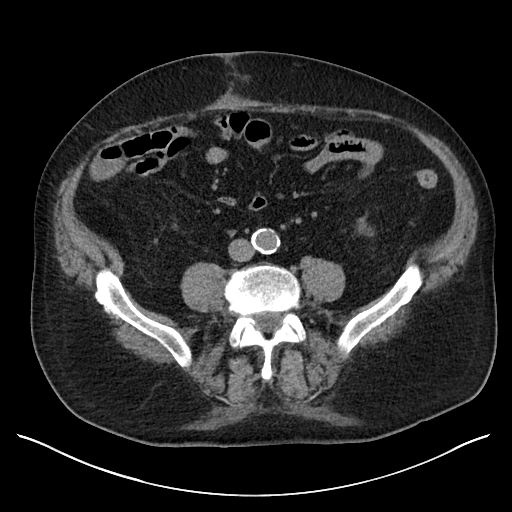
[im 57/107  soft-tissue]
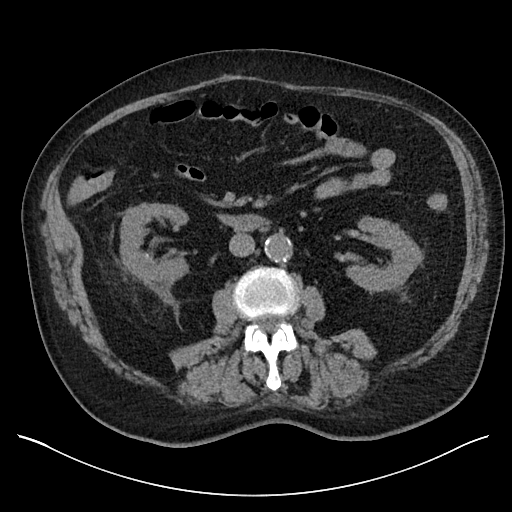
[im 63/107  soft-tissue]
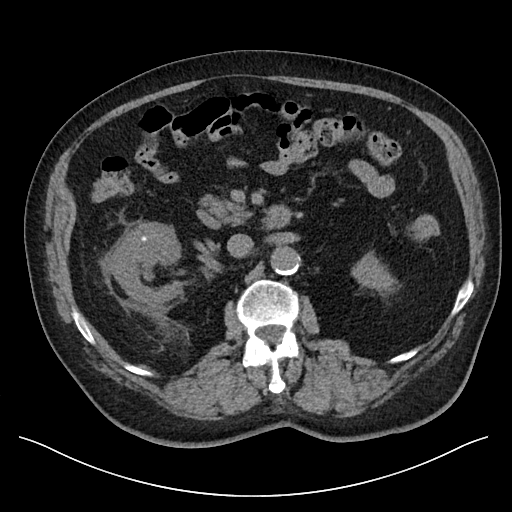
[im 69/107  soft-tissue]
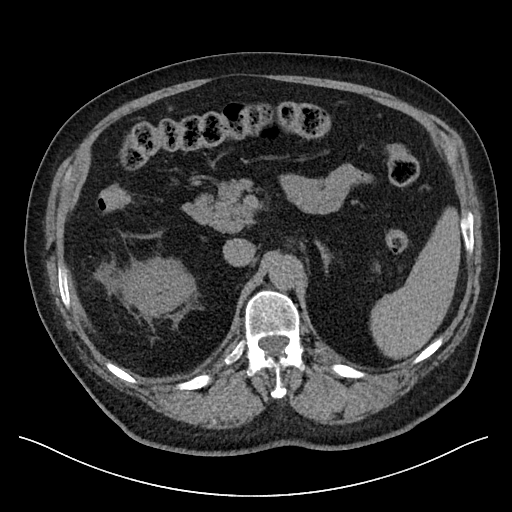
[im 69/107  bone]
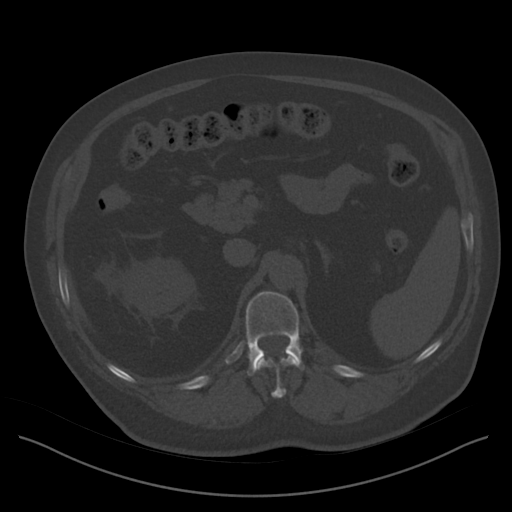
[im 75/107  soft-tissue]
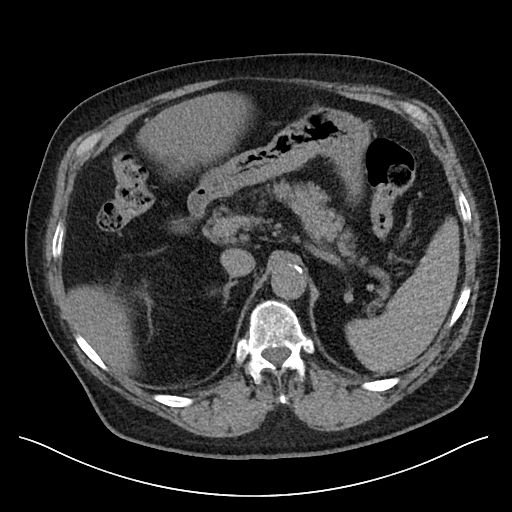
[im 82/107  soft-tissue]
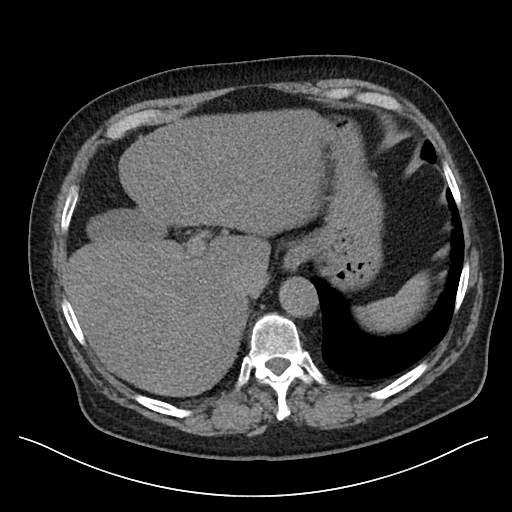
[im 94/107  soft-tissue]
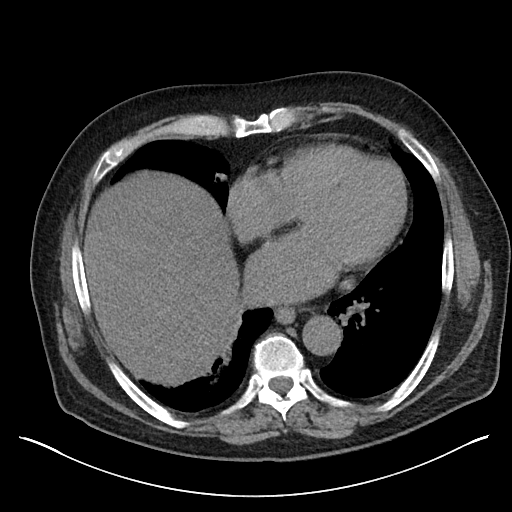
[im 100/107  soft-tissue]
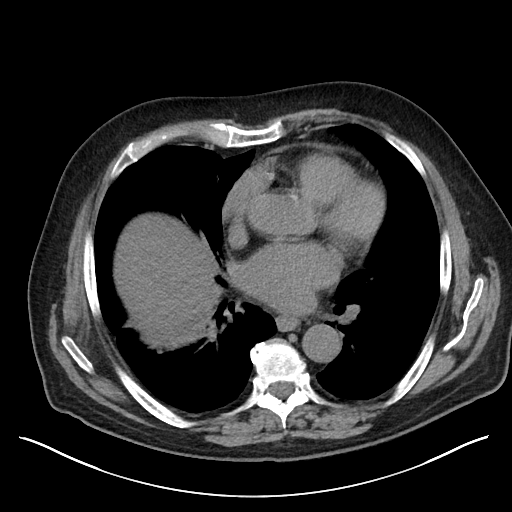

[Series 6: cor · coronal · 0.83mm/px · 3 of 109 slices shown]
[im 37/109  soft-tissue]
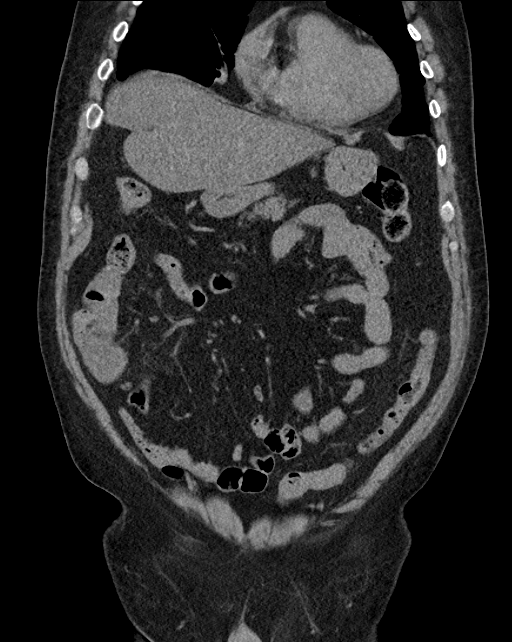
[im 49/109  soft-tissue]
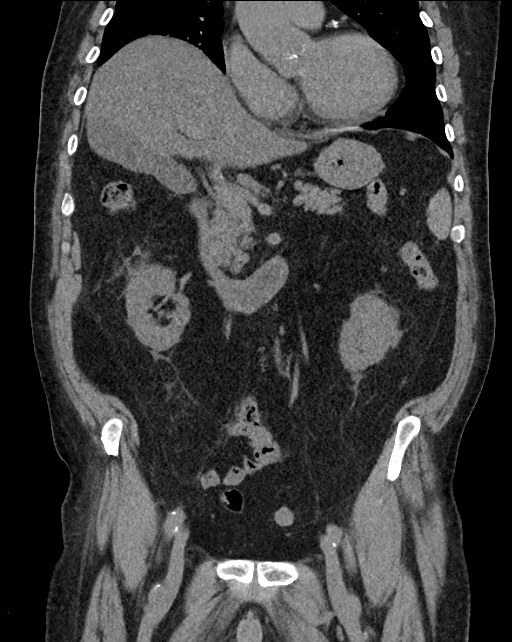
[im 61/109  soft-tissue]
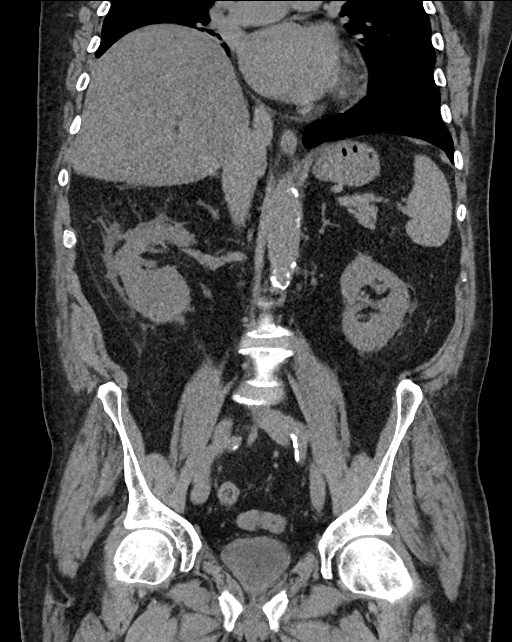

[16 of 46 positions shown; findings below may reference images not displayed]

FINDINGS: Lower chest: No acute abnormality.

Hepatobiliary: Cholelithiasis is noted without inflammation. No
biliary dilatation is noted. Liver is unremarkable on this
unenhanced scan.

Pancreas: Unremarkable. No pancreatic ductal dilatation or
surrounding inflammatory changes.

Spleen: Normal in size without focal abnormality.

Adrenals/Urinary Tract: Adrenal glands appear normal. Left kidney
and ureter are unremarkable. Urinary bladder appears normal. Minimal
right hydroureteronephrosis is noted with perinephric stranding,
secondary to 5 mm calculus in distal right ureter. Small
nonobstructive right renal calculus is noted as well. Stable mildly
complex cyst seen involving inferior pole of right kidney with
peripheral calcifications.

Stomach/Bowel: Stomach is within normal limits. Appendix appears
normal. No evidence of bowel wall thickening, distention, or
inflammatory changes.

Vascular/Lymphatic: Aortic atherosclerosis. No enlarged abdominal or
pelvic lymph nodes.

Reproductive: Status post prostatectomy.

Other: No abdominal wall hernia or abnormality. No abdominopelvic
ascites.

Musculoskeletal: No acute or significant osseous findings.
IMPRESSION: Minimal right hydroureteronephrosis with perinephric stranding is
noted secondary to 5 mm calculus in distal right ureter.

Cholelithiasis without inflammation.

Aortic Atherosclerosis (SSUT8-60T.T).

## 2020-08-03 ENCOUNTER — Ambulatory Visit: Payer: PPO | Admitting: Specialist

## 2020-10-26 ENCOUNTER — Ambulatory Visit: Payer: Medicare HMO | Admitting: Specialist

## 2020-12-01 ENCOUNTER — Ambulatory Visit: Payer: Medicare HMO | Admitting: Specialist

## 2020-12-01 ENCOUNTER — Encounter: Payer: Self-pay | Admitting: Specialist

## 2020-12-01 ENCOUNTER — Other Ambulatory Visit: Payer: Self-pay

## 2020-12-01 VITALS — BP 112/78 | HR 98 | Ht 75.0 in | Wt 223.0 lb

## 2020-12-01 DIAGNOSIS — M4802 Spinal stenosis, cervical region: Secondary | ICD-10-CM

## 2020-12-01 DIAGNOSIS — M47816 Spondylosis without myelopathy or radiculopathy, lumbar region: Secondary | ICD-10-CM

## 2020-12-01 DIAGNOSIS — M48062 Spinal stenosis, lumbar region with neurogenic claudication: Secondary | ICD-10-CM

## 2020-12-01 DIAGNOSIS — M47812 Spondylosis without myelopathy or radiculopathy, cervical region: Secondary | ICD-10-CM | POA: Diagnosis not present

## 2020-12-01 MED ORDER — TRAMADOL-ACETAMINOPHEN 37.5-325 MG PO TABS: 1.0000 | ORAL_TABLET | Freq: Four times a day (QID) | ORAL | 0 refills | Status: DC | PRN

## 2020-12-01 MED ORDER — TRAMADOL-ACETAMINOPHEN 37.5-325 MG PO TABS: 1.0000 | ORAL_TABLET | Freq: Four times a day (QID) | ORAL | 0 refills | Status: AC | PRN

## 2020-12-01 NOTE — Addendum Note (Signed)
Addended by: Basil Dess on: 12/01/2020 11:40 AM   Modules accepted: Orders

## 2020-12-01 NOTE — Progress Notes (Signed)
Office Visit Note   Patient: Jonathan Wiley           Date of Birth: [birth year]           MRN: [id number] Visit Date: 12/01/2020              Requested by: Nicoletta Dress, MD [address] [place],   [id number] PCP: Nicoletta Dress, MD   Assessment & Plan: Visit Diagnoses:  1. Spondylosis without myelopathy or radiculopathy, cervical region   2. Spinal stenosis of cervical region   3. Spondylosis without myelopathy or radiculopathy, lumbar region   4. Spinal stenosis of lumbar region with neurogenic claudication     Plan: Avoid overhead lifting and overhead use of the arms. Do not lift greater than 5 lbs. Adjust head rest in vehicle to prevent hyperextension if rear ended. Take extra precautions to avoid falling. Referral to PT for cervical spondylosis exercises. Avoid bending, stooping and avoid lifting weights greater than 10 lbs. Avoid prolong standing and walking. Avoid frequent bending and stooping  No lifting greater than 10 lbs. May use ice or moist heat for pain. Weight loss is of benefit. Handicap license is approved.  Follow-Up Instructions: Return in about 6 weeks (around 01/12/2021).   Orders:  Orders Placed This Encounter  Procedures   Ambulatory referral to Physical Therapy    No orders of the defined types were placed in this encounter.     Procedures: No procedures performed   Clinical Data: No additional findings.   Subjective: Chief Complaint  Patient presents with   Left Knee - Follow-up    Doing fine right now   Neck - Follow-up    States that he had bad spell with it x 3 weeks ago, couldn't sleep (only in a recliner) x 1week, trouble with rotating head x 6 days, since then it has improved, some discomfort now but nothing like it was x 3 weeks ago.    HPI  Review of Systems   Objective: Vital Signs: BP 112/78 (BP Location: Left Arm, Patient Position: Sitting)   Pulse 98   Ht '6\' 3"'$  (1.905 m)   Wt  223 lb (101.2 kg)   BMI 27.87 kg/m   Physical Exam  Ortho Exam  Specialty Comments:  No specialty comments available.  Imaging: No results found.   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

## 2020-12-01 NOTE — Patient Instructions (Signed)
Plan: Avoid overhead lifting and overhead use of the arms. Do not lift greater than 5 lbs. Adjust head rest in vehicle to prevent hyperextension if rear ended. Take extra precautions to avoid falling. Referral to PT for cervical spondylosis exercises. Avoid bending, stooping and avoid lifting weights greater than 10 lbs. Avoid prolong standing and walking. Avoid frequent bending and stooping  No lifting greater than 10 lbs. May use ice or moist heat for pain. Weight loss is of benefit. Handicap license is approved.

## 2021-01-12 ENCOUNTER — Ambulatory Visit: Payer: Medicare HMO | Admitting: Specialist

## 2021-03-15 ENCOUNTER — Ambulatory Visit: Payer: Medicare HMO | Admitting: Specialist

## 2021-04-10 DIAGNOSIS — M47892 Other spondylosis, cervical region: Secondary | ICD-10-CM | POA: Diagnosis not present

## 2021-04-10 DIAGNOSIS — M542 Cervicalgia: Secondary | ICD-10-CM | POA: Diagnosis not present

## 2021-04-12 ENCOUNTER — Ambulatory Visit: Payer: Medicare HMO | Admitting: Specialist

## 2021-04-13 DIAGNOSIS — M47892 Other spondylosis, cervical region: Secondary | ICD-10-CM | POA: Diagnosis not present

## 2021-04-13 DIAGNOSIS — M542 Cervicalgia: Secondary | ICD-10-CM | POA: Diagnosis not present

## 2021-04-17 DIAGNOSIS — H524 Presbyopia: Secondary | ICD-10-CM | POA: Diagnosis not present

## 2021-04-17 DIAGNOSIS — H43813 Vitreous degeneration, bilateral: Secondary | ICD-10-CM | POA: Diagnosis not present

## 2021-04-17 DIAGNOSIS — G7001 Myasthenia gravis with (acute) exacerbation: Secondary | ICD-10-CM | POA: Diagnosis not present

## 2021-04-17 DIAGNOSIS — H26493 Other secondary cataract, bilateral: Secondary | ICD-10-CM | POA: Diagnosis not present

## 2021-04-17 DIAGNOSIS — H02839 Dermatochalasis of unspecified eye, unspecified eyelid: Secondary | ICD-10-CM | POA: Diagnosis not present

## 2021-04-17 DIAGNOSIS — Z961 Presence of intraocular lens: Secondary | ICD-10-CM | POA: Diagnosis not present

## 2021-04-18 DIAGNOSIS — M47892 Other spondylosis, cervical region: Secondary | ICD-10-CM | POA: Diagnosis not present

## 2021-04-18 DIAGNOSIS — M542 Cervicalgia: Secondary | ICD-10-CM | POA: Diagnosis not present

## 2021-04-24 DIAGNOSIS — I700 Atherosclerosis of aorta: Secondary | ICD-10-CM | POA: Diagnosis not present

## 2021-04-24 DIAGNOSIS — R918 Other nonspecific abnormal finding of lung field: Secondary | ICD-10-CM | POA: Diagnosis not present

## 2021-04-24 DIAGNOSIS — G7000 Myasthenia gravis without (acute) exacerbation: Secondary | ICD-10-CM | POA: Diagnosis not present

## 2021-04-24 DIAGNOSIS — I359 Nonrheumatic aortic valve disorder, unspecified: Secondary | ICD-10-CM | POA: Diagnosis not present

## 2021-05-10 DIAGNOSIS — G609 Hereditary and idiopathic neuropathy, unspecified: Secondary | ICD-10-CM | POA: Diagnosis not present

## 2021-05-10 DIAGNOSIS — Z79899 Other long term (current) drug therapy: Secondary | ICD-10-CM | POA: Diagnosis not present

## 2021-05-10 DIAGNOSIS — I480 Paroxysmal atrial fibrillation: Secondary | ICD-10-CM | POA: Diagnosis not present

## 2021-05-10 DIAGNOSIS — E559 Vitamin D deficiency, unspecified: Secondary | ICD-10-CM | POA: Diagnosis not present

## 2021-05-10 DIAGNOSIS — G7000 Myasthenia gravis without (acute) exacerbation: Secondary | ICD-10-CM | POA: Diagnosis not present

## 2021-05-10 DIAGNOSIS — R519 Headache, unspecified: Secondary | ICD-10-CM | POA: Diagnosis not present

## 2021-05-10 DIAGNOSIS — I10 Essential (primary) hypertension: Secondary | ICD-10-CM | POA: Diagnosis not present

## 2021-05-16 DIAGNOSIS — Z87891 Personal history of nicotine dependence: Secondary | ICD-10-CM | POA: Diagnosis not present

## 2021-05-16 DIAGNOSIS — J984 Other disorders of lung: Secondary | ICD-10-CM | POA: Diagnosis not present

## 2021-07-21 DIAGNOSIS — L814 Other melanin hyperpigmentation: Secondary | ICD-10-CM | POA: Diagnosis not present

## 2021-07-21 DIAGNOSIS — L300 Nummular dermatitis: Secondary | ICD-10-CM | POA: Diagnosis not present

## 2021-07-21 DIAGNOSIS — D485 Neoplasm of uncertain behavior of skin: Secondary | ICD-10-CM | POA: Diagnosis not present

## 2021-07-21 DIAGNOSIS — L821 Other seborrheic keratosis: Secondary | ICD-10-CM | POA: Diagnosis not present

## 2021-07-21 DIAGNOSIS — L570 Actinic keratosis: Secondary | ICD-10-CM | POA: Diagnosis not present

## 2021-08-03 DIAGNOSIS — H532 Diplopia: Secondary | ICD-10-CM | POA: Diagnosis not present

## 2021-08-03 DIAGNOSIS — R918 Other nonspecific abnormal finding of lung field: Secondary | ICD-10-CM | POA: Diagnosis not present

## 2021-08-03 DIAGNOSIS — G7000 Myasthenia gravis without (acute) exacerbation: Secondary | ICD-10-CM | POA: Diagnosis not present

## 2021-08-17 DIAGNOSIS — G7000 Myasthenia gravis without (acute) exacerbation: Secondary | ICD-10-CM | POA: Diagnosis not present

## 2021-08-29 DIAGNOSIS — G4730 Sleep apnea, unspecified: Secondary | ICD-10-CM | POA: Diagnosis not present

## 2021-08-29 DIAGNOSIS — G4733 Obstructive sleep apnea (adult) (pediatric): Secondary | ICD-10-CM | POA: Diagnosis not present

## 2021-09-04 DIAGNOSIS — G7000 Myasthenia gravis without (acute) exacerbation: Secondary | ICD-10-CM | POA: Diagnosis not present

## 2021-09-05 DIAGNOSIS — G7000 Myasthenia gravis without (acute) exacerbation: Secondary | ICD-10-CM | POA: Diagnosis not present

## 2021-09-06 DIAGNOSIS — G7000 Myasthenia gravis without (acute) exacerbation: Secondary | ICD-10-CM | POA: Diagnosis not present

## 2021-09-07 DIAGNOSIS — M4802 Spinal stenosis, cervical region: Secondary | ICD-10-CM | POA: Diagnosis not present

## 2021-09-07 DIAGNOSIS — Z79899 Other long term (current) drug therapy: Secondary | ICD-10-CM | POA: Diagnosis not present

## 2021-09-07 DIAGNOSIS — G7000 Myasthenia gravis without (acute) exacerbation: Secondary | ICD-10-CM | POA: Diagnosis not present

## 2021-09-11 DIAGNOSIS — I482 Chronic atrial fibrillation, unspecified: Secondary | ICD-10-CM | POA: Diagnosis not present

## 2021-09-11 DIAGNOSIS — E785 Hyperlipidemia, unspecified: Secondary | ICD-10-CM | POA: Diagnosis not present

## 2021-09-11 DIAGNOSIS — R918 Other nonspecific abnormal finding of lung field: Secondary | ICD-10-CM | POA: Diagnosis not present

## 2021-09-11 DIAGNOSIS — Z79899 Other long term (current) drug therapy: Secondary | ICD-10-CM | POA: Diagnosis not present

## 2021-09-11 DIAGNOSIS — K219 Gastro-esophageal reflux disease without esophagitis: Secondary | ICD-10-CM | POA: Diagnosis not present

## 2021-09-11 DIAGNOSIS — G7000 Myasthenia gravis without (acute) exacerbation: Secondary | ICD-10-CM | POA: Diagnosis not present

## 2021-09-11 DIAGNOSIS — I10 Essential (primary) hypertension: Secondary | ICD-10-CM | POA: Diagnosis not present

## 2021-09-11 DIAGNOSIS — Z125 Encounter for screening for malignant neoplasm of prostate: Secondary | ICD-10-CM | POA: Diagnosis not present

## 2021-10-04 DIAGNOSIS — G7000 Myasthenia gravis without (acute) exacerbation: Secondary | ICD-10-CM | POA: Diagnosis not present

## 2021-10-04 DIAGNOSIS — I4819 Other persistent atrial fibrillation: Secondary | ICD-10-CM | POA: Diagnosis not present

## 2021-10-04 DIAGNOSIS — Z7901 Long term (current) use of anticoagulants: Secondary | ICD-10-CM | POA: Diagnosis not present

## 2021-10-04 DIAGNOSIS — I10 Essential (primary) hypertension: Secondary | ICD-10-CM | POA: Diagnosis not present

## 2021-10-04 DIAGNOSIS — I2510 Atherosclerotic heart disease of native coronary artery without angina pectoris: Secondary | ICD-10-CM | POA: Diagnosis not present

## 2021-10-11 DIAGNOSIS — G7000 Myasthenia gravis without (acute) exacerbation: Secondary | ICD-10-CM | POA: Diagnosis not present

## 2021-10-12 DIAGNOSIS — G7000 Myasthenia gravis without (acute) exacerbation: Secondary | ICD-10-CM | POA: Diagnosis not present

## 2021-10-24 DIAGNOSIS — G7000 Myasthenia gravis without (acute) exacerbation: Secondary | ICD-10-CM | POA: Diagnosis not present

## 2021-10-24 DIAGNOSIS — R109 Unspecified abdominal pain: Secondary | ICD-10-CM | POA: Diagnosis not present

## 2021-10-24 DIAGNOSIS — Z79899 Other long term (current) drug therapy: Secondary | ICD-10-CM | POA: Diagnosis not present

## 2021-10-25 DIAGNOSIS — R109 Unspecified abdominal pain: Secondary | ICD-10-CM | POA: Diagnosis not present

## 2021-11-03 DIAGNOSIS — R918 Other nonspecific abnormal finding of lung field: Secondary | ICD-10-CM | POA: Diagnosis not present

## 2021-11-03 DIAGNOSIS — R911 Solitary pulmonary nodule: Secondary | ICD-10-CM | POA: Diagnosis not present

## 2021-11-03 DIAGNOSIS — C61 Malignant neoplasm of prostate: Secondary | ICD-10-CM | POA: Diagnosis not present

## 2021-11-09 DIAGNOSIS — H532 Diplopia: Secondary | ICD-10-CM | POA: Diagnosis not present

## 2021-11-09 DIAGNOSIS — G7000 Myasthenia gravis without (acute) exacerbation: Secondary | ICD-10-CM | POA: Diagnosis not present

## 2021-11-13 DIAGNOSIS — G7000 Myasthenia gravis without (acute) exacerbation: Secondary | ICD-10-CM | POA: Diagnosis not present

## 2021-11-14 DIAGNOSIS — G7000 Myasthenia gravis without (acute) exacerbation: Secondary | ICD-10-CM | POA: Diagnosis not present

## 2021-12-14 DIAGNOSIS — G7000 Myasthenia gravis without (acute) exacerbation: Secondary | ICD-10-CM | POA: Diagnosis not present

## 2021-12-15 DIAGNOSIS — G7000 Myasthenia gravis without (acute) exacerbation: Secondary | ICD-10-CM | POA: Diagnosis not present

## 2021-12-19 DIAGNOSIS — R0609 Other forms of dyspnea: Secondary | ICD-10-CM | POA: Diagnosis not present

## 2022-01-03 DIAGNOSIS — G4730 Sleep apnea, unspecified: Secondary | ICD-10-CM | POA: Diagnosis not present

## 2022-01-03 DIAGNOSIS — I480 Paroxysmal atrial fibrillation: Secondary | ICD-10-CM | POA: Diagnosis not present

## 2022-01-03 DIAGNOSIS — G7000 Myasthenia gravis without (acute) exacerbation: Secondary | ICD-10-CM | POA: Diagnosis not present

## 2022-01-15 DIAGNOSIS — G7000 Myasthenia gravis without (acute) exacerbation: Secondary | ICD-10-CM | POA: Diagnosis not present

## 2022-01-16 DIAGNOSIS — G7000 Myasthenia gravis without (acute) exacerbation: Secondary | ICD-10-CM | POA: Diagnosis not present

## 2022-01-24 DIAGNOSIS — G7000 Myasthenia gravis without (acute) exacerbation: Secondary | ICD-10-CM | POA: Diagnosis not present

## 2022-01-24 DIAGNOSIS — Z79899 Other long term (current) drug therapy: Secondary | ICD-10-CM | POA: Diagnosis not present

## 2022-02-02 DIAGNOSIS — I10 Essential (primary) hypertension: Secondary | ICD-10-CM | POA: Diagnosis not present

## 2022-02-02 DIAGNOSIS — I4821 Permanent atrial fibrillation: Secondary | ICD-10-CM | POA: Diagnosis not present

## 2022-02-02 DIAGNOSIS — I2510 Atherosclerotic heart disease of native coronary artery without angina pectoris: Secondary | ICD-10-CM | POA: Diagnosis not present

## 2022-02-02 DIAGNOSIS — R9439 Abnormal result of other cardiovascular function study: Secondary | ICD-10-CM | POA: Diagnosis not present

## 2022-02-02 DIAGNOSIS — I4891 Unspecified atrial fibrillation: Secondary | ICD-10-CM | POA: Diagnosis not present

## 2022-02-15 DIAGNOSIS — G7000 Myasthenia gravis without (acute) exacerbation: Secondary | ICD-10-CM | POA: Diagnosis not present

## 2022-02-16 DIAGNOSIS — G7000 Myasthenia gravis without (acute) exacerbation: Secondary | ICD-10-CM | POA: Diagnosis not present

## 2022-02-19 DIAGNOSIS — R911 Solitary pulmonary nodule: Secondary | ICD-10-CM | POA: Diagnosis not present

## 2022-02-19 DIAGNOSIS — G7000 Myasthenia gravis without (acute) exacerbation: Secondary | ICD-10-CM | POA: Diagnosis not present

## 2022-02-19 DIAGNOSIS — H532 Diplopia: Secondary | ICD-10-CM | POA: Diagnosis not present

## 2022-03-05 DIAGNOSIS — Z23 Encounter for immunization: Secondary | ICD-10-CM | POA: Diagnosis not present

## 2022-03-10 DIAGNOSIS — L570 Actinic keratosis: Secondary | ICD-10-CM | POA: Diagnosis not present

## 2022-03-10 DIAGNOSIS — L821 Other seborrheic keratosis: Secondary | ICD-10-CM | POA: Diagnosis not present

## 2022-03-10 DIAGNOSIS — L814 Other melanin hyperpigmentation: Secondary | ICD-10-CM | POA: Diagnosis not present

## 2022-03-12 DIAGNOSIS — G4733 Obstructive sleep apnea (adult) (pediatric): Secondary | ICD-10-CM | POA: Diagnosis not present

## 2022-03-13 DIAGNOSIS — Z79899 Other long term (current) drug therapy: Secondary | ICD-10-CM | POA: Diagnosis not present

## 2022-03-13 DIAGNOSIS — R0989 Other specified symptoms and signs involving the circulatory and respiratory systems: Secondary | ICD-10-CM | POA: Diagnosis not present

## 2022-03-13 DIAGNOSIS — I482 Chronic atrial fibrillation, unspecified: Secondary | ICD-10-CM | POA: Diagnosis not present

## 2022-03-13 DIAGNOSIS — Z8546 Personal history of malignant neoplasm of prostate: Secondary | ICD-10-CM | POA: Diagnosis not present

## 2022-03-13 DIAGNOSIS — I10 Essential (primary) hypertension: Secondary | ICD-10-CM | POA: Diagnosis not present

## 2022-03-13 DIAGNOSIS — E785 Hyperlipidemia, unspecified: Secondary | ICD-10-CM | POA: Diagnosis not present

## 2022-03-19 DIAGNOSIS — G7000 Myasthenia gravis without (acute) exacerbation: Secondary | ICD-10-CM | POA: Diagnosis not present

## 2022-03-20 DIAGNOSIS — G7000 Myasthenia gravis without (acute) exacerbation: Secondary | ICD-10-CM | POA: Diagnosis not present

## 2022-04-12 DIAGNOSIS — G4733 Obstructive sleep apnea (adult) (pediatric): Secondary | ICD-10-CM | POA: Diagnosis not present

## 2022-04-18 DIAGNOSIS — G7000 Myasthenia gravis without (acute) exacerbation: Secondary | ICD-10-CM | POA: Diagnosis not present

## 2022-04-26 DIAGNOSIS — Z79899 Other long term (current) drug therapy: Secondary | ICD-10-CM | POA: Diagnosis not present

## 2022-04-26 DIAGNOSIS — G7000 Myasthenia gravis without (acute) exacerbation: Secondary | ICD-10-CM | POA: Diagnosis not present

## 2022-05-08 DIAGNOSIS — I480 Paroxysmal atrial fibrillation: Secondary | ICD-10-CM | POA: Diagnosis not present

## 2022-05-08 DIAGNOSIS — G7000 Myasthenia gravis without (acute) exacerbation: Secondary | ICD-10-CM | POA: Diagnosis not present

## 2022-05-08 DIAGNOSIS — G4730 Sleep apnea, unspecified: Secondary | ICD-10-CM | POA: Diagnosis not present

## 2022-05-13 DIAGNOSIS — G4733 Obstructive sleep apnea (adult) (pediatric): Secondary | ICD-10-CM | POA: Diagnosis not present

## 2022-05-17 DIAGNOSIS — G7000 Myasthenia gravis without (acute) exacerbation: Secondary | ICD-10-CM | POA: Diagnosis not present

## 2022-06-06 DIAGNOSIS — I480 Paroxysmal atrial fibrillation: Secondary | ICD-10-CM | POA: Diagnosis not present

## 2022-06-11 DIAGNOSIS — G4733 Obstructive sleep apnea (adult) (pediatric): Secondary | ICD-10-CM | POA: Diagnosis not present

## 2022-06-19 DIAGNOSIS — G7000 Myasthenia gravis without (acute) exacerbation: Secondary | ICD-10-CM | POA: Diagnosis not present

## 2022-06-25 DIAGNOSIS — G7000 Myasthenia gravis without (acute) exacerbation: Secondary | ICD-10-CM | POA: Diagnosis not present

## 2022-06-25 DIAGNOSIS — I4891 Unspecified atrial fibrillation: Secondary | ICD-10-CM | POA: Diagnosis not present

## 2022-06-25 DIAGNOSIS — G4730 Sleep apnea, unspecified: Secondary | ICD-10-CM | POA: Diagnosis not present

## 2022-07-12 DIAGNOSIS — G4733 Obstructive sleep apnea (adult) (pediatric): Secondary | ICD-10-CM | POA: Diagnosis not present

## 2022-07-23 DIAGNOSIS — R195 Other fecal abnormalities: Secondary | ICD-10-CM | POA: Diagnosis not present

## 2022-07-23 DIAGNOSIS — K649 Unspecified hemorrhoids: Secondary | ICD-10-CM | POA: Diagnosis not present

## 2022-07-23 DIAGNOSIS — K219 Gastro-esophageal reflux disease without esophagitis: Secondary | ICD-10-CM | POA: Diagnosis not present

## 2022-07-24 DIAGNOSIS — G7000 Myasthenia gravis without (acute) exacerbation: Secondary | ICD-10-CM | POA: Diagnosis not present

## 2022-08-01 DIAGNOSIS — G7000 Myasthenia gravis without (acute) exacerbation: Secondary | ICD-10-CM | POA: Diagnosis not present

## 2022-08-11 DIAGNOSIS — G4733 Obstructive sleep apnea (adult) (pediatric): Secondary | ICD-10-CM | POA: Diagnosis not present

## 2022-08-23 DIAGNOSIS — Z8669 Personal history of other diseases of the nervous system and sense organs: Secondary | ICD-10-CM | POA: Diagnosis not present

## 2022-08-23 DIAGNOSIS — G7000 Myasthenia gravis without (acute) exacerbation: Secondary | ICD-10-CM | POA: Diagnosis not present

## 2022-08-23 DIAGNOSIS — L539 Erythematous condition, unspecified: Secondary | ICD-10-CM | POA: Diagnosis not present

## 2022-08-30 DIAGNOSIS — G4733 Obstructive sleep apnea (adult) (pediatric): Secondary | ICD-10-CM | POA: Diagnosis not present

## 2022-08-30 DIAGNOSIS — G4730 Sleep apnea, unspecified: Secondary | ICD-10-CM | POA: Diagnosis not present

## 2022-08-31 DIAGNOSIS — K219 Gastro-esophageal reflux disease without esophagitis: Secondary | ICD-10-CM | POA: Diagnosis not present

## 2022-08-31 DIAGNOSIS — I482 Chronic atrial fibrillation, unspecified: Secondary | ICD-10-CM | POA: Diagnosis not present

## 2022-09-05 DIAGNOSIS — G7000 Myasthenia gravis without (acute) exacerbation: Secondary | ICD-10-CM | POA: Diagnosis not present

## 2022-09-11 DIAGNOSIS — G4733 Obstructive sleep apnea (adult) (pediatric): Secondary | ICD-10-CM | POA: Diagnosis not present

## 2022-09-12 DIAGNOSIS — Z79899 Other long term (current) drug therapy: Secondary | ICD-10-CM | POA: Diagnosis not present

## 2022-09-12 DIAGNOSIS — Z8546 Personal history of malignant neoplasm of prostate: Secondary | ICD-10-CM | POA: Diagnosis not present

## 2022-09-12 DIAGNOSIS — R918 Other nonspecific abnormal finding of lung field: Secondary | ICD-10-CM | POA: Diagnosis not present

## 2022-09-12 DIAGNOSIS — E785 Hyperlipidemia, unspecified: Secondary | ICD-10-CM | POA: Diagnosis not present

## 2022-09-12 DIAGNOSIS — I10 Essential (primary) hypertension: Secondary | ICD-10-CM | POA: Diagnosis not present

## 2022-09-12 DIAGNOSIS — I482 Chronic atrial fibrillation, unspecified: Secondary | ICD-10-CM | POA: Diagnosis not present

## 2022-09-12 DIAGNOSIS — K219 Gastro-esophageal reflux disease without esophagitis: Secondary | ICD-10-CM | POA: Diagnosis not present

## 2022-09-15 DIAGNOSIS — R233 Spontaneous ecchymoses: Secondary | ICD-10-CM | POA: Diagnosis not present

## 2022-09-15 DIAGNOSIS — L570 Actinic keratosis: Secondary | ICD-10-CM | POA: Diagnosis not present

## 2022-09-15 DIAGNOSIS — I8310 Varicose veins of unspecified lower extremity with inflammation: Secondary | ICD-10-CM | POA: Diagnosis not present

## 2022-09-19 DIAGNOSIS — H43813 Vitreous degeneration, bilateral: Secondary | ICD-10-CM | POA: Diagnosis not present

## 2022-09-19 DIAGNOSIS — H02839 Dermatochalasis of unspecified eye, unspecified eyelid: Secondary | ICD-10-CM | POA: Diagnosis not present

## 2022-09-19 DIAGNOSIS — H26493 Other secondary cataract, bilateral: Secondary | ICD-10-CM | POA: Diagnosis not present

## 2022-09-19 DIAGNOSIS — G7001 Myasthenia gravis with (acute) exacerbation: Secondary | ICD-10-CM | POA: Diagnosis not present

## 2022-09-19 DIAGNOSIS — Z961 Presence of intraocular lens: Secondary | ICD-10-CM | POA: Diagnosis not present

## 2022-10-11 DIAGNOSIS — G4733 Obstructive sleep apnea (adult) (pediatric): Secondary | ICD-10-CM | POA: Diagnosis not present

## 2022-10-17 DIAGNOSIS — G7000 Myasthenia gravis without (acute) exacerbation: Secondary | ICD-10-CM | POA: Diagnosis not present

## 2022-10-18 DIAGNOSIS — R1310 Dysphagia, unspecified: Secondary | ICD-10-CM | POA: Diagnosis not present

## 2022-10-18 DIAGNOSIS — G7000 Myasthenia gravis without (acute) exacerbation: Secondary | ICD-10-CM | POA: Diagnosis not present

## 2022-10-18 DIAGNOSIS — K649 Unspecified hemorrhoids: Secondary | ICD-10-CM | POA: Diagnosis not present

## 2022-10-18 DIAGNOSIS — R195 Other fecal abnormalities: Secondary | ICD-10-CM | POA: Diagnosis not present

## 2022-10-18 DIAGNOSIS — K219 Gastro-esophageal reflux disease without esophagitis: Secondary | ICD-10-CM | POA: Diagnosis not present

## 2022-10-31 DIAGNOSIS — Z9181 History of falling: Secondary | ICD-10-CM | POA: Diagnosis not present

## 2022-10-31 DIAGNOSIS — Z0000 Encounter for general adult medical examination without abnormal findings: Secondary | ICD-10-CM | POA: Diagnosis not present

## 2022-11-11 DIAGNOSIS — G4733 Obstructive sleep apnea (adult) (pediatric): Secondary | ICD-10-CM | POA: Diagnosis not present

## 2022-11-14 DIAGNOSIS — I2510 Atherosclerotic heart disease of native coronary artery without angina pectoris: Secondary | ICD-10-CM | POA: Diagnosis not present

## 2022-11-27 DIAGNOSIS — I4891 Unspecified atrial fibrillation: Secondary | ICD-10-CM | POA: Diagnosis not present

## 2022-11-27 DIAGNOSIS — Z86010 Personal history of colonic polyps: Secondary | ICD-10-CM | POA: Diagnosis not present

## 2022-11-27 DIAGNOSIS — Z09 Encounter for follow-up examination after completed treatment for conditions other than malignant neoplasm: Secondary | ICD-10-CM | POA: Diagnosis not present

## 2022-11-27 DIAGNOSIS — I10 Essential (primary) hypertension: Secondary | ICD-10-CM | POA: Diagnosis not present

## 2022-11-27 DIAGNOSIS — K5730 Diverticulosis of large intestine without perforation or abscess without bleeding: Secondary | ICD-10-CM | POA: Diagnosis not present

## 2022-11-27 DIAGNOSIS — J449 Chronic obstructive pulmonary disease, unspecified: Secondary | ICD-10-CM | POA: Diagnosis not present

## 2022-11-30 DIAGNOSIS — R918 Other nonspecific abnormal finding of lung field: Secondary | ICD-10-CM | POA: Diagnosis not present

## 2022-11-30 DIAGNOSIS — R590 Localized enlarged lymph nodes: Secondary | ICD-10-CM | POA: Diagnosis not present

## 2022-12-05 DIAGNOSIS — G4730 Sleep apnea, unspecified: Secondary | ICD-10-CM | POA: Diagnosis not present

## 2022-12-05 DIAGNOSIS — G7000 Myasthenia gravis without (acute) exacerbation: Secondary | ICD-10-CM | POA: Diagnosis not present

## 2022-12-05 DIAGNOSIS — G609 Hereditary and idiopathic neuropathy, unspecified: Secondary | ICD-10-CM | POA: Diagnosis not present

## 2022-12-12 DIAGNOSIS — G4733 Obstructive sleep apnea (adult) (pediatric): Secondary | ICD-10-CM | POA: Diagnosis not present

## 2022-12-31 DIAGNOSIS — N2889 Other specified disorders of kidney and ureter: Secondary | ICD-10-CM | POA: Diagnosis not present

## 2022-12-31 DIAGNOSIS — K8020 Calculus of gallbladder without cholecystitis without obstruction: Secondary | ICD-10-CM | POA: Diagnosis not present

## 2022-12-31 DIAGNOSIS — N281 Cyst of kidney, acquired: Secondary | ICD-10-CM | POA: Diagnosis not present

## 2022-12-31 DIAGNOSIS — C61 Malignant neoplasm of prostate: Secondary | ICD-10-CM | POA: Diagnosis not present

## 2023-01-01 DIAGNOSIS — H02831 Dermatochalasis of right upper eyelid: Secondary | ICD-10-CM | POA: Diagnosis not present

## 2023-01-01 DIAGNOSIS — H26493 Other secondary cataract, bilateral: Secondary | ICD-10-CM | POA: Diagnosis not present

## 2023-01-01 DIAGNOSIS — H18413 Arcus senilis, bilateral: Secondary | ICD-10-CM | POA: Diagnosis not present

## 2023-01-01 DIAGNOSIS — Z961 Presence of intraocular lens: Secondary | ICD-10-CM | POA: Diagnosis not present

## 2023-01-01 DIAGNOSIS — H26492 Other secondary cataract, left eye: Secondary | ICD-10-CM | POA: Diagnosis not present

## 2023-01-09 DIAGNOSIS — G7000 Myasthenia gravis without (acute) exacerbation: Secondary | ICD-10-CM | POA: Diagnosis not present

## 2023-01-09 DIAGNOSIS — G4730 Sleep apnea, unspecified: Secondary | ICD-10-CM | POA: Diagnosis not present

## 2023-01-09 DIAGNOSIS — I4891 Unspecified atrial fibrillation: Secondary | ICD-10-CM | POA: Diagnosis not present

## 2023-01-11 DIAGNOSIS — G4733 Obstructive sleep apnea (adult) (pediatric): Secondary | ICD-10-CM | POA: Diagnosis not present

## 2023-01-15 DIAGNOSIS — H26491 Other secondary cataract, right eye: Secondary | ICD-10-CM | POA: Diagnosis not present

## 2023-02-11 DIAGNOSIS — G4733 Obstructive sleep apnea (adult) (pediatric): Secondary | ICD-10-CM | POA: Diagnosis not present

## 2023-02-21 DIAGNOSIS — G7000 Myasthenia gravis without (acute) exacerbation: Secondary | ICD-10-CM | POA: Diagnosis not present

## 2023-03-11 DIAGNOSIS — I4891 Unspecified atrial fibrillation: Secondary | ICD-10-CM | POA: Diagnosis not present

## 2023-03-11 DIAGNOSIS — I2510 Atherosclerotic heart disease of native coronary artery without angina pectoris: Secondary | ICD-10-CM | POA: Diagnosis not present

## 2023-03-11 DIAGNOSIS — I10 Essential (primary) hypertension: Secondary | ICD-10-CM | POA: Diagnosis not present

## 2023-03-11 DIAGNOSIS — C61 Malignant neoplasm of prostate: Secondary | ICD-10-CM | POA: Diagnosis not present

## 2023-03-11 DIAGNOSIS — E785 Hyperlipidemia, unspecified: Secondary | ICD-10-CM | POA: Diagnosis not present

## 2023-03-11 DIAGNOSIS — M4802 Spinal stenosis, cervical region: Secondary | ICD-10-CM | POA: Diagnosis not present

## 2023-03-11 DIAGNOSIS — G7000 Myasthenia gravis without (acute) exacerbation: Secondary | ICD-10-CM | POA: Diagnosis not present

## 2023-03-11 DIAGNOSIS — G609 Hereditary and idiopathic neuropathy, unspecified: Secondary | ICD-10-CM | POA: Diagnosis not present

## 2023-03-12 DIAGNOSIS — I499 Cardiac arrhythmia, unspecified: Secondary | ICD-10-CM | POA: Diagnosis not present

## 2023-03-12 DIAGNOSIS — I4891 Unspecified atrial fibrillation: Secondary | ICD-10-CM | POA: Diagnosis not present

## 2023-03-13 DIAGNOSIS — G4733 Obstructive sleep apnea (adult) (pediatric): Secondary | ICD-10-CM | POA: Diagnosis not present

## 2023-03-19 DIAGNOSIS — K219 Gastro-esophageal reflux disease without esophagitis: Secondary | ICD-10-CM | POA: Diagnosis not present

## 2023-03-19 DIAGNOSIS — I10 Essential (primary) hypertension: Secondary | ICD-10-CM | POA: Diagnosis not present

## 2023-03-19 DIAGNOSIS — R918 Other nonspecific abnormal finding of lung field: Secondary | ICD-10-CM | POA: Diagnosis not present

## 2023-03-19 DIAGNOSIS — E785 Hyperlipidemia, unspecified: Secondary | ICD-10-CM | POA: Diagnosis not present

## 2023-03-19 DIAGNOSIS — Z8546 Personal history of malignant neoplasm of prostate: Secondary | ICD-10-CM | POA: Diagnosis not present

## 2023-03-19 DIAGNOSIS — G7000 Myasthenia gravis without (acute) exacerbation: Secondary | ICD-10-CM | POA: Diagnosis not present

## 2023-03-19 DIAGNOSIS — I482 Chronic atrial fibrillation, unspecified: Secondary | ICD-10-CM | POA: Diagnosis not present

## 2023-05-25 DIAGNOSIS — L299 Pruritus, unspecified: Secondary | ICD-10-CM | POA: Diagnosis not present

## 2023-05-25 DIAGNOSIS — L719 Rosacea, unspecified: Secondary | ICD-10-CM | POA: Diagnosis not present

## 2023-05-25 DIAGNOSIS — L821 Other seborrheic keratosis: Secondary | ICD-10-CM | POA: Diagnosis not present

## 2023-05-25 DIAGNOSIS — I872 Venous insufficiency (chronic) (peripheral): Secondary | ICD-10-CM | POA: Diagnosis not present

## 2023-06-05 DIAGNOSIS — G7000 Myasthenia gravis without (acute) exacerbation: Secondary | ICD-10-CM | POA: Diagnosis not present

## 2023-06-07 DIAGNOSIS — U071 COVID-19: Secondary | ICD-10-CM | POA: Diagnosis not present

## 2023-06-07 DIAGNOSIS — R509 Fever, unspecified: Secondary | ICD-10-CM | POA: Diagnosis not present

## 2023-08-22 DIAGNOSIS — G7000 Myasthenia gravis without (acute) exacerbation: Secondary | ICD-10-CM | POA: Diagnosis not present

## 2023-08-22 DIAGNOSIS — Z8669 Personal history of other diseases of the nervous system and sense organs: Secondary | ICD-10-CM | POA: Diagnosis not present

## 2023-09-17 DIAGNOSIS — G7000 Myasthenia gravis without (acute) exacerbation: Secondary | ICD-10-CM | POA: Diagnosis not present

## 2023-09-17 DIAGNOSIS — I482 Chronic atrial fibrillation, unspecified: Secondary | ICD-10-CM | POA: Diagnosis not present

## 2023-09-17 DIAGNOSIS — E785 Hyperlipidemia, unspecified: Secondary | ICD-10-CM | POA: Diagnosis not present

## 2023-09-17 DIAGNOSIS — I10 Essential (primary) hypertension: Secondary | ICD-10-CM | POA: Diagnosis not present

## 2023-09-17 DIAGNOSIS — K219 Gastro-esophageal reflux disease without esophagitis: Secondary | ICD-10-CM | POA: Diagnosis not present

## 2023-09-17 DIAGNOSIS — R918 Other nonspecific abnormal finding of lung field: Secondary | ICD-10-CM | POA: Diagnosis not present

## 2023-09-17 DIAGNOSIS — Z8546 Personal history of malignant neoplasm of prostate: Secondary | ICD-10-CM | POA: Diagnosis not present

## 2023-11-05 DIAGNOSIS — Z9181 History of falling: Secondary | ICD-10-CM | POA: Diagnosis not present

## 2023-11-05 DIAGNOSIS — Z0000 Encounter for general adult medical examination without abnormal findings: Secondary | ICD-10-CM | POA: Diagnosis not present

## 2023-11-16 DIAGNOSIS — D2239 Melanocytic nevi of other parts of face: Secondary | ICD-10-CM | POA: Diagnosis not present

## 2023-11-16 DIAGNOSIS — L719 Rosacea, unspecified: Secondary | ICD-10-CM | POA: Diagnosis not present

## 2023-11-16 DIAGNOSIS — L570 Actinic keratosis: Secondary | ICD-10-CM | POA: Diagnosis not present

## 2023-11-16 DIAGNOSIS — L821 Other seborrheic keratosis: Secondary | ICD-10-CM | POA: Diagnosis not present

## 2023-12-06 DIAGNOSIS — G609 Hereditary and idiopathic neuropathy, unspecified: Secondary | ICD-10-CM | POA: Diagnosis not present

## 2023-12-06 DIAGNOSIS — M4802 Spinal stenosis, cervical region: Secondary | ICD-10-CM | POA: Diagnosis not present

## 2023-12-06 DIAGNOSIS — G7000 Myasthenia gravis without (acute) exacerbation: Secondary | ICD-10-CM | POA: Diagnosis not present

## 2023-12-11 DIAGNOSIS — G4730 Sleep apnea, unspecified: Secondary | ICD-10-CM | POA: Diagnosis not present

## 2023-12-11 DIAGNOSIS — I4811 Longstanding persistent atrial fibrillation: Secondary | ICD-10-CM | POA: Diagnosis not present

## 2023-12-11 DIAGNOSIS — I10 Essential (primary) hypertension: Secondary | ICD-10-CM | POA: Diagnosis not present

## 2023-12-13 DIAGNOSIS — I4891 Unspecified atrial fibrillation: Secondary | ICD-10-CM | POA: Diagnosis not present

## 2024-01-08 DIAGNOSIS — I4891 Unspecified atrial fibrillation: Secondary | ICD-10-CM | POA: Diagnosis not present

## 2024-01-08 DIAGNOSIS — I480 Paroxysmal atrial fibrillation: Secondary | ICD-10-CM | POA: Diagnosis not present

## 2024-01-08 DIAGNOSIS — G4730 Sleep apnea, unspecified: Secondary | ICD-10-CM | POA: Diagnosis not present

## 2024-01-08 DIAGNOSIS — G7000 Myasthenia gravis without (acute) exacerbation: Secondary | ICD-10-CM | POA: Diagnosis not present

## 2024-01-20 DIAGNOSIS — R059 Cough, unspecified: Secondary | ICD-10-CM | POA: Diagnosis not present

## 2024-01-20 DIAGNOSIS — J208 Acute bronchitis due to other specified organisms: Secondary | ICD-10-CM | POA: Diagnosis not present

## 2024-01-20 DIAGNOSIS — B9689 Other specified bacterial agents as the cause of diseases classified elsewhere: Secondary | ICD-10-CM | POA: Diagnosis not present
